# Patient Record
Sex: Male | Born: 1960 | Race: Black or African American | Hispanic: No | Marital: Single | Smoking: Never smoker
Health system: Southern US, Community
[De-identification: ages and names within clinical notes are randomized; demographics above are authoritative.]

## PROBLEM LIST (undated history)

## (undated) DIAGNOSIS — E7800 Pure hypercholesterolemia, unspecified: Secondary | ICD-10-CM

## (undated) DIAGNOSIS — I10 Essential (primary) hypertension: Secondary | ICD-10-CM

## (undated) HISTORY — DX: Essential (primary) hypertension: I10

## (undated) HISTORY — PX: KNEE SURGERY: SHX244

## (undated) HISTORY — DX: Pure hypercholesterolemia, unspecified: E78.00

---

## 2002-08-30 ENCOUNTER — Emergency Department (HOSPITAL_COMMUNITY): Admission: EM | Admit: 2002-08-30 | Discharge: 2002-08-30 | Payer: Self-pay | Admitting: *Deleted

## 2004-09-25 ENCOUNTER — Emergency Department (HOSPITAL_COMMUNITY): Admission: EM | Admit: 2004-09-25 | Discharge: 2004-09-25 | Payer: Self-pay | Admitting: Emergency Medicine

## 2006-02-20 ENCOUNTER — Ambulatory Visit (HOSPITAL_COMMUNITY): Admission: RE | Admit: 2006-02-20 | Discharge: 2006-02-20 | Payer: Self-pay | Admitting: Family Medicine

## 2007-06-17 ENCOUNTER — Ambulatory Visit: Payer: Self-pay | Admitting: Gastroenterology

## 2007-07-08 ENCOUNTER — Ambulatory Visit (HOSPITAL_COMMUNITY): Admission: RE | Admit: 2007-07-08 | Discharge: 2007-07-08 | Payer: Self-pay | Admitting: Gastroenterology

## 2007-07-08 ENCOUNTER — Ambulatory Visit: Payer: Self-pay | Admitting: Gastroenterology

## 2007-08-28 ENCOUNTER — Ambulatory Visit: Payer: Self-pay | Admitting: Gastroenterology

## 2008-06-11 DIAGNOSIS — K625 Hemorrhage of anus and rectum: Secondary | ICD-10-CM

## 2008-06-11 DIAGNOSIS — A5400 Gonococcal infection of lower genitourinary tract, unspecified: Secondary | ICD-10-CM | POA: Insufficient documentation

## 2008-06-11 DIAGNOSIS — A5601 Chlamydial cystitis and urethritis: Secondary | ICD-10-CM

## 2008-06-11 DIAGNOSIS — K649 Unspecified hemorrhoids: Secondary | ICD-10-CM | POA: Insufficient documentation

## 2008-06-11 DIAGNOSIS — K6289 Other specified diseases of anus and rectum: Secondary | ICD-10-CM

## 2008-06-11 DIAGNOSIS — I10 Essential (primary) hypertension: Secondary | ICD-10-CM | POA: Insufficient documentation

## 2011-02-21 NOTE — Assessment & Plan Note (Signed)
NAMEMarland Kitchen  MASARU, CHAMBERLIN                  CHART#:  [id number]   DATE:  08/28/2007                       DOB:  1961/08/21   REFERRING PHYSICIAN:  Tillman Abide, [facility] Department   PROBLEM LIST:  1. Rectal bleeding.  2. Hypertension.  3. History of GC and Chlamydia.   SUBJECTIVE:  Mr. Partin is a 50 year old male, who presents as a return  patient visit.  He had colonoscopy performed in October of 2008, which  revealed no etiology for his rectal bleeding and had presumed  hemorrhoids.  He was given AnusolHC rectal suppositories and other  recommendations to manage hard bowel movements.  He did not finish his  suppositories, but he is 90% better.  He has not seen any rectal  bleeding.  His bowel movements are fine.  Last Friday, he fell and hit  his head and now he has residual resolving bilateral black eyes.   OBJECTIVE:  Weight 200 pounds.  Height 5 feet 9 inches.  Temperature  97.3, blood pressure 132/90, pulse 88.  IN GENERAL:  He is in no apparent distress, alert and oriented times  four.  LUNGS:  Clear to auscultation bilaterally.  CARDIOVASCULAR EXAM:  Showed regular rhythm without murmurs.  ABDOMEN:  Bowel sounds are present, soft, nontender, nondistended.   ASSESSMENT:  Mr. Jackson is a 50 year old male whose rectal bleeding has  resolved.  He has had age-appropriate colon cancer screening.   Thank you for allowing me to see Mr. Deloatch in consultation.  My  recommendations follow.   RECOMMENDATIONS:  1. Screening colonoscopy in ten years.  2. Follow up with Dr. Cira Servant as needed.       Kassie Mends, M.D.  Electronically Signed     SM/MEDQ  D:  08/28/2007  T:  08/28/2007  Job:  [id number]   cc:   [facility] Tillman Abide

## 2011-02-21 NOTE — Op Note (Signed)
NAMEDECLAN, James White                 ACCOUNT NO.:  [id number]   MEDICAL RECORD NO.:  [id number]          PATIENT TYPE:  AMB   LOCATION:  DAY                           FACILITY:  [facility]   PHYSICIAN:  Kassie Mends, M.D.      DATE OF BIRTH:  [birth year]   DATE OF PROCEDURE:  07/08/2007  DATE OF DISCHARGE:                               OPERATIVE REPORT   REFERRING PHYSICIAN:  Kirk Ruths, M.D.   PROCEDURE:  Colonoscopy.   INDICATION FOR EXAM:  The patient is a 50 year old male who presented  with intermittent rectal bleeding.  No family history of colon cancer or  colon polyps.   FINDINGS:  1. Pan colonic diverticulosis.  Otherwise no polyps, masses,      inflammatory changes or AVMs seen.  2. Normal retroflexed view of the rectum.   RECOMMENDATIONS:  1. He should follow a high fiber diet.  He is given a handout on high-      fiber diet, diverticulosis and hemorrhoids.  2. No source for his rectal intermittent rectal bleeding could be      identified.  The most likely etiology for his rectal bleeding is      hemorrhoids.  3. Return patient visit in 3 months with Chyrl Civatte or Tana Coast      to reevaluate his rectal bleeding.  4. Screening colonoscopy in 10 years.   MEDICATIONS:  1. Demerol 25 mg IV.  2. Versed 3 mg IV.   PROCEDURE TECHNIQUE:  Physical exam was performed.  Informed consent was  obtained from the patient after explaining the benefits, risks and  alternatives to the procedure.  The patient was connected to the monitor  and placed in left lateral position.  Continuous oxygen was provided by  nasal cannula and IV medicine administered through an indwelling  cannula.  After administration of sedation and rectal exam, the  patient's rectum was intubated and the  scope was advanced under direct visualization to the cecum.  The scope  was removed slowly by carefully examining the color, texture, anatomy  and integrity of the mucosa on the way out.  The patient  was recovered  in endoscopy and discharged home in satisfactory condition.      Kassie Mends, M.D.  Electronically Signed     SM/MEDQ  D:  07/08/2007  T:  07/08/2007  Job:  [id number]   cc:   Kirk Ruths, M.D.  Fax: [phone]

## 2011-02-21 NOTE — Consult Note (Signed)
James White, White                 ACCOUNT NO.:  [id number]   MEDICAL RECORD NO.:  [id number]          PATIENT TYPE:  AMB   LOCATION:                                FACILITY:  [facility]   PHYSICIAN:  Kassie Mends, M.D.      DATE OF BIRTH:  12-Jun-1961   DATE OF CONSULTATION:  06/16/2007  DATE OF DISCHARGE:                                 CONSULTATION   PRIMARY PHYSICIAN:  Kirk Ruths, M.D.   REASON FOR VISIT:  Digestive problems.   HISTORY OF PRESENT ILLNESS:  James White is a 50 year old, [place]-American  male who has been complaining of digestive problems for 2 years.  His  symptoms actually are getting better.  His symptoms started when he was  treated for gonorrhea and chlamydia.  He states that the medicines  changed his digestive tract.  He states that foods that he was able to  tolerate now took the less time to go through his system.  Severe rectal  pain prompted his visit on today.  He states now his symptoms are not as  bad and only on occasion.  He felt like when he passed a stool his waste  had to get past something.  He called the [employer] nurse who told him it  might be an ulcer.  Her girlfriend thought it was hemorrhoids.  He saw  James White at the health department and she felt it was hemorrhoids.  He also saw blood when he wiped from time to time.  He only has one to  two bowel movements a week.  If he drinks soft drinks or eats fan tail  shrimp, he has watery stool.  Ice cream does the same thing as well.  He  usually has hard formed stool.  He denies any nausea, vomiting or  abdominal pain.   PAST MEDICAL HISTORY:  1. Reported history of hypertension.  2. History of gonorrhea and chlamydia.   PAST SURGICAL HISTORY:  None.   ALLERGIES:  No known drug allergies.   MEDICATIONS:  None.   FAMILY HISTORY:  He denies any family history of colon cancer or colon  polyps.   SOCIAL HISTORY:  He is not married.  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  He does not smoke.  He occasionally drinks  beer.  He may drink beer three times a week.  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   REVIEW OF SYSTEMS:  As per the HPI otherwise all systems negative.   PHYSICAL EXAM:  Weight 207 pounds, height 5 foot 9 inches, BMI 30.6  (obese) temperature 97.9, blood pressure 138/96, pulse 64.  GENERAL:  He is in no apparent distress, alert and oriented x4.  HEENT:  Atraumatic, normocephalic.  Pupils equal and reactive to light.  Mouth no oral lesions.  Posterior pharynx without erythema or exudate.  Poor dentition.  His neck has full range of motion and no  lymphadenopathy.  LUNGS:  Clear to auscultation bilaterally.  CARDIOVASCULAR:  Regular rhythm,  no murmur, normal S1-S2.  ABDOMEN:  Bowel sounds present, soft, nontender, nondistended, no  rebound or guarding.  He has no hepatosplenomegaly, abdominal bruits or  pulsatile masses.  Obese.  EXTREMITIES:  Without cyanosis, clubbing or edema.  NEURO:  He has no focal neurologic deficits.   ASSESSMENT:  James White is a 50 year old male who complains of rectal pain  with bowel movements.  He has a significant history of passing hard  stools.  He has also seeing rectal bleeding.  The differential diagnosis  includes anal fissure, hemorrhoids and a low likelihood of colorectal  polyps or colon cancer.   PLAN:  1. James White is given instructions for the management of his rectal      bleeding in writing.  He is asked to avoid straining with bowel      movements.  He is asked to 6-8 cups of water or juice daily.  He is      asked to follow a high fiber diet.  He is given a handout on high      fiber diet.  2. He is asked to take docusate sodium 100 mg tablets one 3 times a      day and to use Anusol-HC one twice daily for 14 days.  He may also      use glycerin suppositories prior to having a bowel movement for the      next 30 days.  3. He will be scheduled for a colonoscopy  after July 01, 2007 to      evaluate his rectal bleeding and for age-appropriate colon cancer      screening.  The [employer] of gastroenterology recommends      that African-Americans begin colon cancer screening at the age of      70.  4. Follow-up visit to see me in two months.      Kassie Mends, M.D.  Electronically Signed     SM/MEDQ  D:  06/17/2007  T:  06/17/2007  Job:  [id number]   cc:   Kirk Ruths, M.D.  Fax: [phone]

## 2011-12-14 ENCOUNTER — Encounter (INDEPENDENT_AMBULATORY_CARE_PROVIDER_SITE_OTHER): Payer: Self-pay | Admitting: *Deleted

## 2012-01-03 ENCOUNTER — Encounter (INDEPENDENT_AMBULATORY_CARE_PROVIDER_SITE_OTHER): Payer: Self-pay | Admitting: Internal Medicine

## 2012-01-03 ENCOUNTER — Ambulatory Visit (INDEPENDENT_AMBULATORY_CARE_PROVIDER_SITE_OTHER): Payer: Managed Care, Other (non HMO) | Admitting: Internal Medicine

## 2012-01-03 DIAGNOSIS — K219 Gastro-esophageal reflux disease without esophagitis: Secondary | ICD-10-CM

## 2012-01-03 DIAGNOSIS — R1310 Dysphagia, unspecified: Secondary | ICD-10-CM | POA: Insufficient documentation

## 2012-01-03 MED ORDER — PANTOPRAZOLE SODIUM 20 MG PO TBEC: 40.0000 mg | DELAYED_RELEASE_TABLET | Freq: Every day | ORAL | Status: AC

## 2012-01-03 NOTE — Patient Instructions (Signed)
GERD diet. Protonix 40mg  once daily.

## 2012-01-03 NOTE — Progress Notes (Signed)
Subjective:     Patient ID: James White, male   DOB: 06-09-1961, 51 y.o.   MRN: [id number]  HPI Jozsef is a 51 yr old male referred to our office by Landry Mellow [facility] at [facility].  He tells me that if he eats something spicy he will have a sharp pain in his esophagus. He he get excited or joking, he will have a sharp pain in his esophagus.  If he drinks or eats something it feels like it is coming back up in his esophagus. He feels like sometimes food is not going down his esophagus. Symptoms x 2 yrs He has had to cough the bolus back up. No problem liquids.   Appetite is good. No weight loss.  He usually has a BM about 1-2 a day.  No melena. Sometimes he will blood when he is constipated. He has tried Designer, fashion/clothing but forgets to take it.    Review of Systems see hpi Current Outpatient Prescriptions  Medication Sig Dispense Refill  . Olmesartan-Amlodipine-HCTZ (TRIBENZOR PO) Take by mouth.       Past Medical History  Diagnosis Date  . Hypertension   . High cholesterol    History reviewed. No pertinent past surgical history. Family Status  Relation Status Death Age  . Mother Deceased     Thyroid  . Father Deceased     etoh  . Brother      One deceased from GSW in head. One has enlarged heart and  and MI. One has diabetes., CVA, and RA   History   Social History  . Marital Status: Single    Spouse Name: N/A    Number of Children: N/A  . Years of Education: N/A   Occupational History  . Not on file.   Social History Main Topics  . Smoking status: Never Smoker   . Smokeless tobacco: Not on file  . Alcohol Use: No  . Drug Use: No  . Sexually Active: Not on file   Other Topics Concern  . Not on file   Social History Narrative  . No narrative on file   No Known Allergies      Objective:   Physical Exam Filed Vitals:   01/03/12 1522  Height: 5' 8.5" (1.74 m)  Weight: 223 lb 1.6 oz (101.197 kg)  Alert and oriented. Skin warm and dry. Oral mucosa is moist.   .  Sclera anicteric, conjunctivae is pink. Thyroid not enlarged. No cervical lymphadenopathy. Lungs clear. Heart regular rate and rhythm.  Abdomen is soft. Bowel sounds are positive. No hepatomegaly. No abdominal masses felt. No tenderness.  No edema to lower extremities. Patient is alert and oriented.       Assessment:    Uncontrolled GERD, Dysphagia. He is not maintained on a PPI at this time.    Plan:    Rx for Protonix. Elevate head of bed. GERD diet. Barium Pill study.

## 2012-01-04 ENCOUNTER — Ambulatory Visit (HOSPITAL_COMMUNITY)
Admission: RE | Admit: 2012-01-04 | Discharge: 2012-01-04 | Disposition: A | Discharge status: Home / Self Care | Payer: Managed Care, Other (non HMO) | Source: Ambulatory Visit | Attending: Internal Medicine | Admitting: Internal Medicine

## 2012-01-04 DIAGNOSIS — K224 Dyskinesia of esophagus: Secondary | ICD-10-CM | POA: Insufficient documentation

## 2012-01-04 DIAGNOSIS — K219 Gastro-esophageal reflux disease without esophagitis: Secondary | ICD-10-CM | POA: Insufficient documentation

## 2012-01-04 DIAGNOSIS — R1310 Dysphagia, unspecified: Secondary | ICD-10-CM | POA: Insufficient documentation

## 2012-04-30 ENCOUNTER — Ambulatory Visit (HOSPITAL_COMMUNITY)
Admission: RE | Admit: 2012-04-30 | Discharge: 2012-04-30 | Disposition: A | Discharge status: Home / Self Care | Payer: Managed Care, Other (non HMO) | Source: Ambulatory Visit | Attending: Orthopedic Surgery | Admitting: Orthopedic Surgery

## 2012-04-30 ENCOUNTER — Other Ambulatory Visit (HOSPITAL_COMMUNITY): Payer: Self-pay | Admitting: Orthopedic Surgery

## 2012-04-30 DIAGNOSIS — Z139 Encounter for screening, unspecified: Secondary | ICD-10-CM

## 2012-04-30 DIAGNOSIS — Z1389 Encounter for screening for other disorder: Secondary | ICD-10-CM | POA: Insufficient documentation

## 2013-05-04 ENCOUNTER — Encounter (HOSPITAL_COMMUNITY): Payer: Self-pay | Admitting: *Deleted

## 2013-05-04 ENCOUNTER — Emergency Department (HOSPITAL_COMMUNITY)
Admission: EM | Admit: 2013-05-04 | Discharge: 2013-05-04 | Disposition: A | Discharge status: Home / Self Care | Payer: Managed Care, Other (non HMO) | Attending: Emergency Medicine | Admitting: Emergency Medicine

## 2013-05-04 DIAGNOSIS — E7800 Pure hypercholesterolemia, unspecified: Secondary | ICD-10-CM | POA: Insufficient documentation

## 2013-05-04 DIAGNOSIS — I10 Essential (primary) hypertension: Secondary | ICD-10-CM | POA: Insufficient documentation

## 2013-05-04 DIAGNOSIS — B029 Zoster without complications: Secondary | ICD-10-CM | POA: Insufficient documentation

## 2013-05-04 MED ORDER — HYDROCODONE-ACETAMINOPHEN 5-325 MG PO TABS: 2.0000 | ORAL_TABLET | ORAL | Status: DC | PRN

## 2013-05-04 MED ORDER — ACYCLOVIR 400 MG PO TABS: 800.0000 mg | ORAL_TABLET | Freq: Every day | ORAL | Status: DC

## 2013-05-04 MED ORDER — PREDNISONE 10 MG PO TABS: 20.0000 mg | ORAL_TABLET | Freq: Two times a day (BID) | ORAL | Status: DC

## 2013-05-04 NOTE — ED Provider Notes (Signed)
  CSN: [id number]     Arrival date & time 05/04/13  0010 History     First MD Initiated Contact with Patient 05/04/13 0041     Chief Complaint  Patient presents with  . Insect Bite   (Consider location/radiation/quality/duration/timing/severity/associated sxs/prior Treatment) Patient is a 52 y.o. male presenting with rash. The history is provided by the patient.  Rash Pain location: right back. Pain quality: burning   Pain radiates to:  Does not radiate Pain severity:  Moderate Onset quality:  Gradual Duration:  5 days Timing:  Constant Progression:  Worsening Chronicity:  New Relieved by:  Nothing Worsened by:  Nothing tried   Past Medical History  Diagnosis Date  . Hypertension   . High cholesterol    Past Surgical History  Procedure Laterality Date  . Knee surgery     No family history on file. History  Substance Use Topics  . Smoking status: Never Smoker   . Smokeless tobacco: Not on file  . Alcohol Use: No    Review of Systems  Skin: Positive for rash.  All other systems reviewed and are negative.    Allergies  Review of patient's allergies indicates no known allergies.  Home Medications   Current Outpatient Rx  Name  Route  Sig  Dispense  Refill  . Olmesartan-Amlodipine-HCTZ (TRIBENZOR PO)   Oral   Take by mouth.         Marland Kitchen acyclovir (ZOVIRAX) 400 MG tablet   Oral   Take 2 tablets (800 mg total) by mouth 5 (five) times daily.   50 tablet   0   . HYDROcodone-acetaminophen (NORCO) 5-325 MG per tablet   Oral   Take 2 tablets by mouth every 4 (four) hours as needed for pain.   20 tablet   0   . predniSONE (DELTASONE) 10 MG tablet   Oral   Take 2 tablets (20 mg total) by mouth 2 (two) times daily.   20 tablet   0    BP 151/110  Pulse 77  Temp(Src) 98 F (36.7 C) (Oral)  Resp 20  Ht 5\' 8"  (1.727 m)  Wt 218 lb (98.884 kg)  BMI 33.15 kg/m2  SpO2 98% Physical Exam  Nursing note and vitals reviewed. Constitutional: He is oriented to  person, place, and time. He appears well-developed and well-nourished. No distress.  HENT:  Head: Normocephalic and atraumatic.  Mouth/Throat: Oropharynx is clear and moist.  Neck: Normal range of motion. Neck supple.  Cardiovascular: Normal rate, regular rhythm and normal heart sounds.   No murmur heard. Pulmonary/Chest: Effort normal and breath sounds normal. No respiratory distress.  Abdominal: Bowel sounds are normal.  Musculoskeletal: Normal range of motion.  Neurological: He is alert and oriented to person, place, and time.  Skin: He is not diaphoretic.  There is a vesicular rash in a dermatomal pattern noted on the right mid posterior thoracic region.      ED Course   Procedures (including critical care time)  Labs Reviewed - No data to display No results found. 1. Shingles     MDM  Shingles.  Will treat accordingly.  Return prn.  Geoffery Lyons, MD 05/04/13 2026

## 2013-05-04 NOTE — ED Notes (Signed)
Pt reports he thinks he has an insect bit to back. Slight burning sensation

## 2017-08-22 ENCOUNTER — Ambulatory Visit (HOSPITAL_COMMUNITY)
Admission: RE | Admit: 2017-08-22 | Discharge: 2017-08-22 | Disposition: A | Discharge status: Home / Self Care | Payer: 59 | Source: Ambulatory Visit | Attending: Family Medicine | Admitting: Family Medicine

## 2017-08-22 ENCOUNTER — Other Ambulatory Visit (HOSPITAL_COMMUNITY): Payer: Self-pay | Admitting: Family Medicine

## 2017-08-22 DIAGNOSIS — S82002A Unspecified fracture of left patella, initial encounter for closed fracture: Secondary | ICD-10-CM

## 2017-08-22 DIAGNOSIS — M7989 Other specified soft tissue disorders: Secondary | ICD-10-CM | POA: Diagnosis not present

## 2017-08-22 DIAGNOSIS — M1712 Unilateral primary osteoarthritis, left knee: Secondary | ICD-10-CM | POA: Insufficient documentation

## 2017-08-22 DIAGNOSIS — M25569 Pain in unspecified knee: Secondary | ICD-10-CM | POA: Diagnosis not present

## 2019-05-16 IMAGING — DX DG KNEE COMPLETE 4+V*L*
5 series · 5 of 5 positions shown · non-contrast
Comparison: None.

CLINICAL DATA: Status post blunt trauma.  Pain and swelling.

EXAM:
LEFT KNEE - COMPLETE 4+ VIEW

[knee ap]
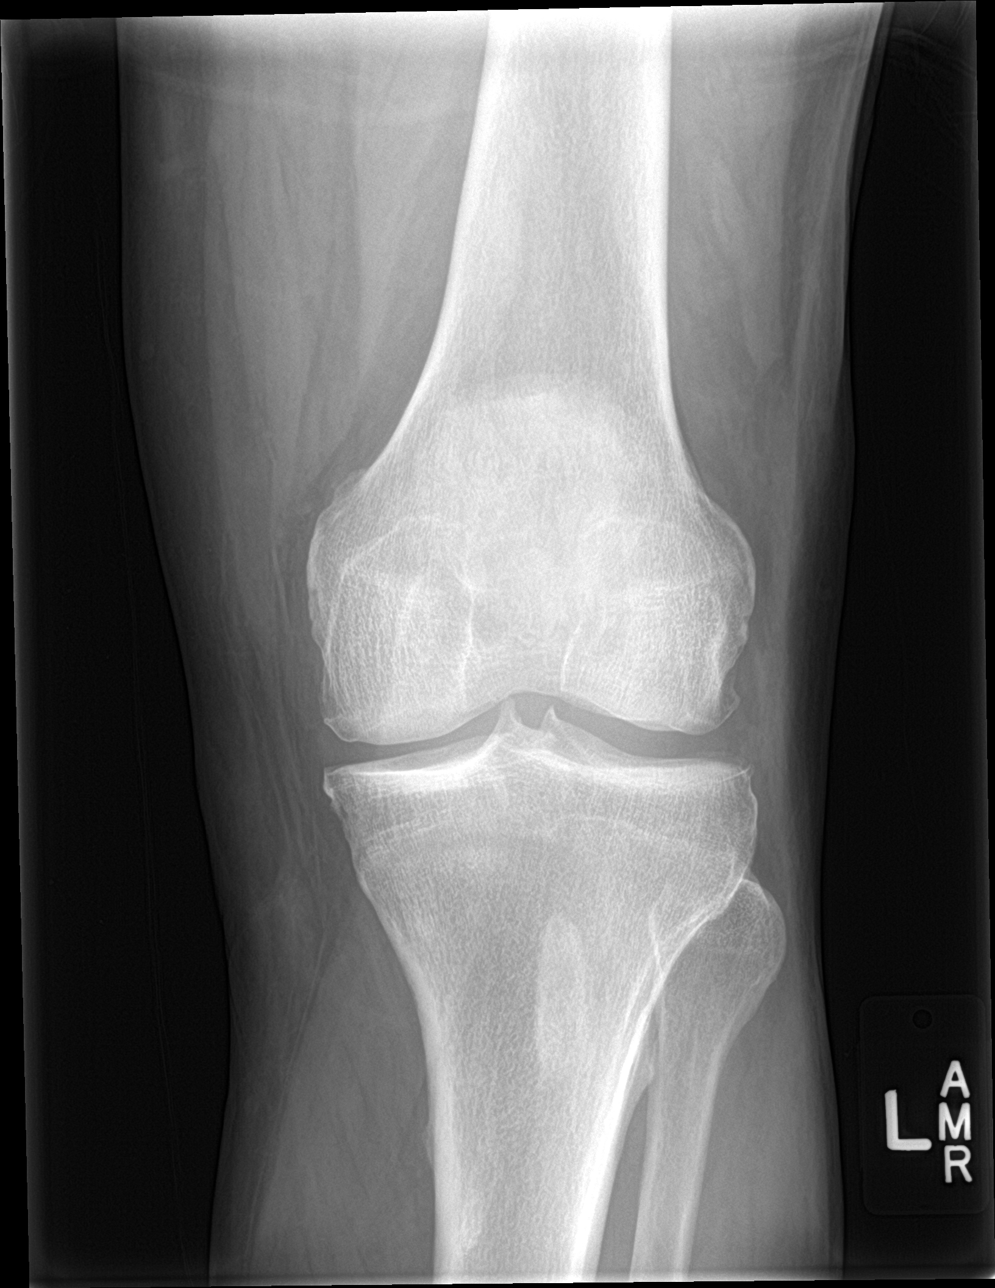

[tunnel]
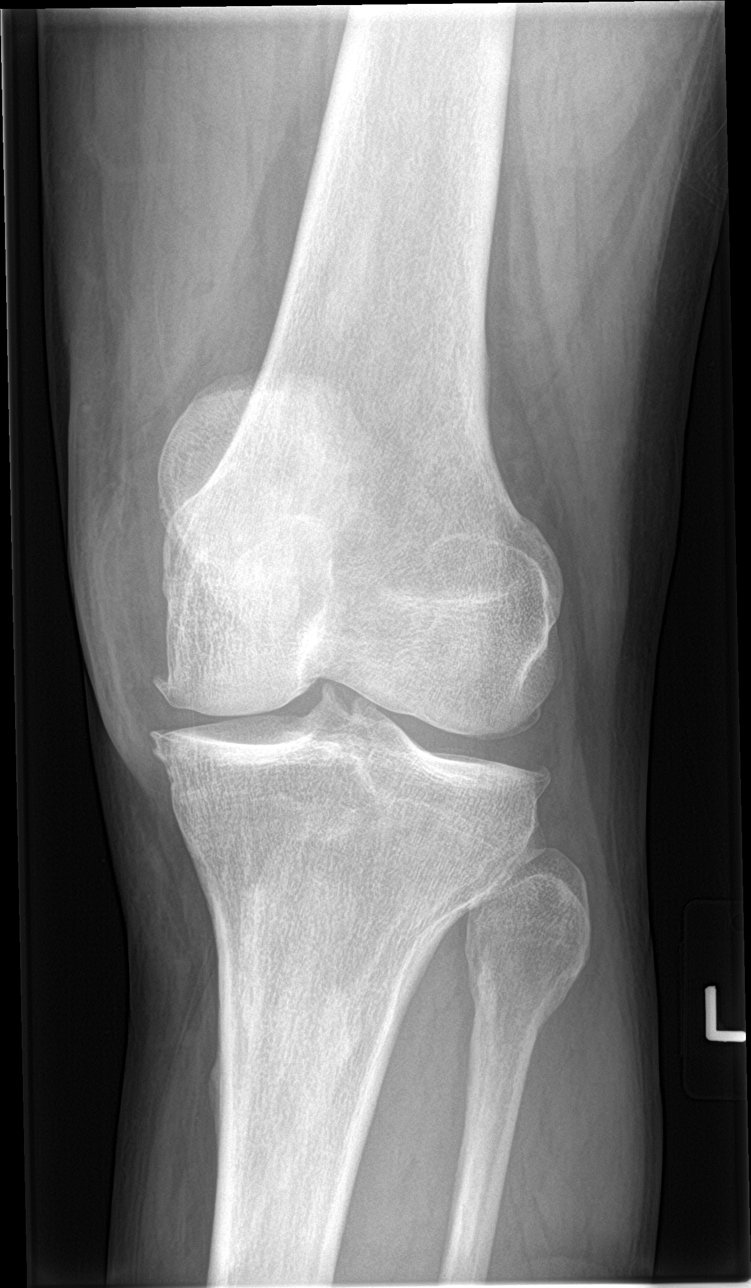

[knee lat]
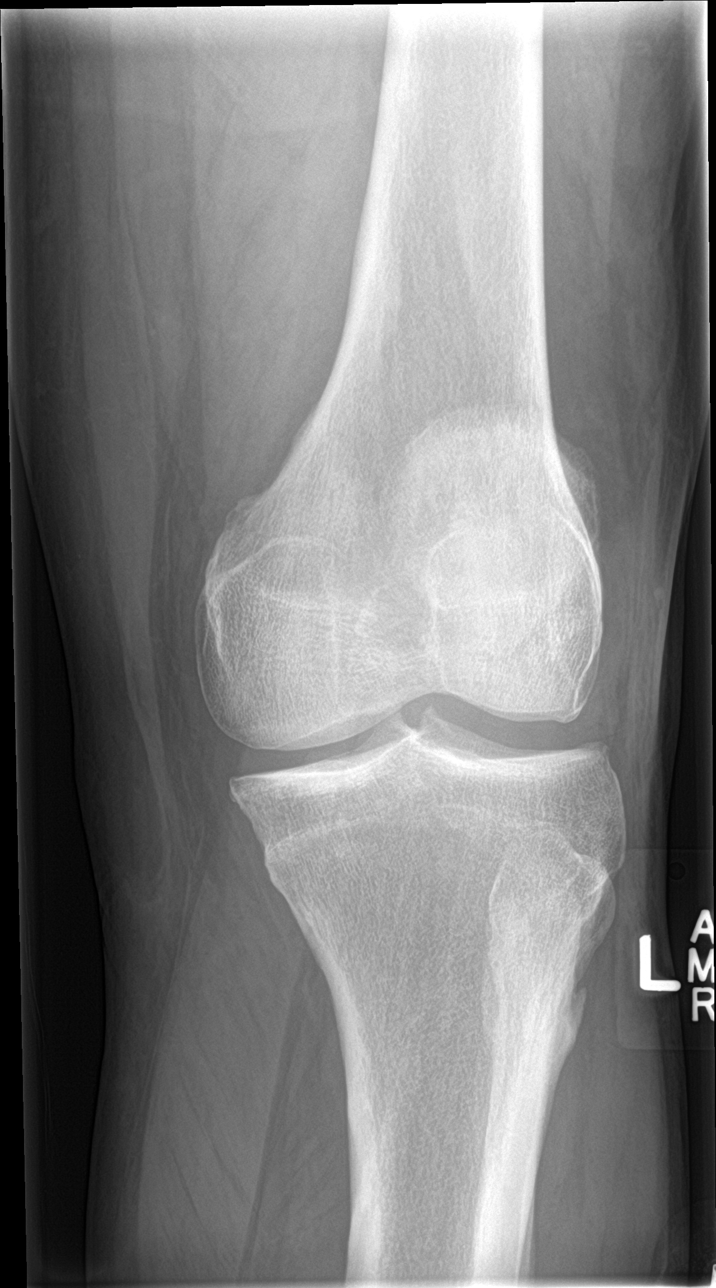

[knee sunrise (1 of 2)]
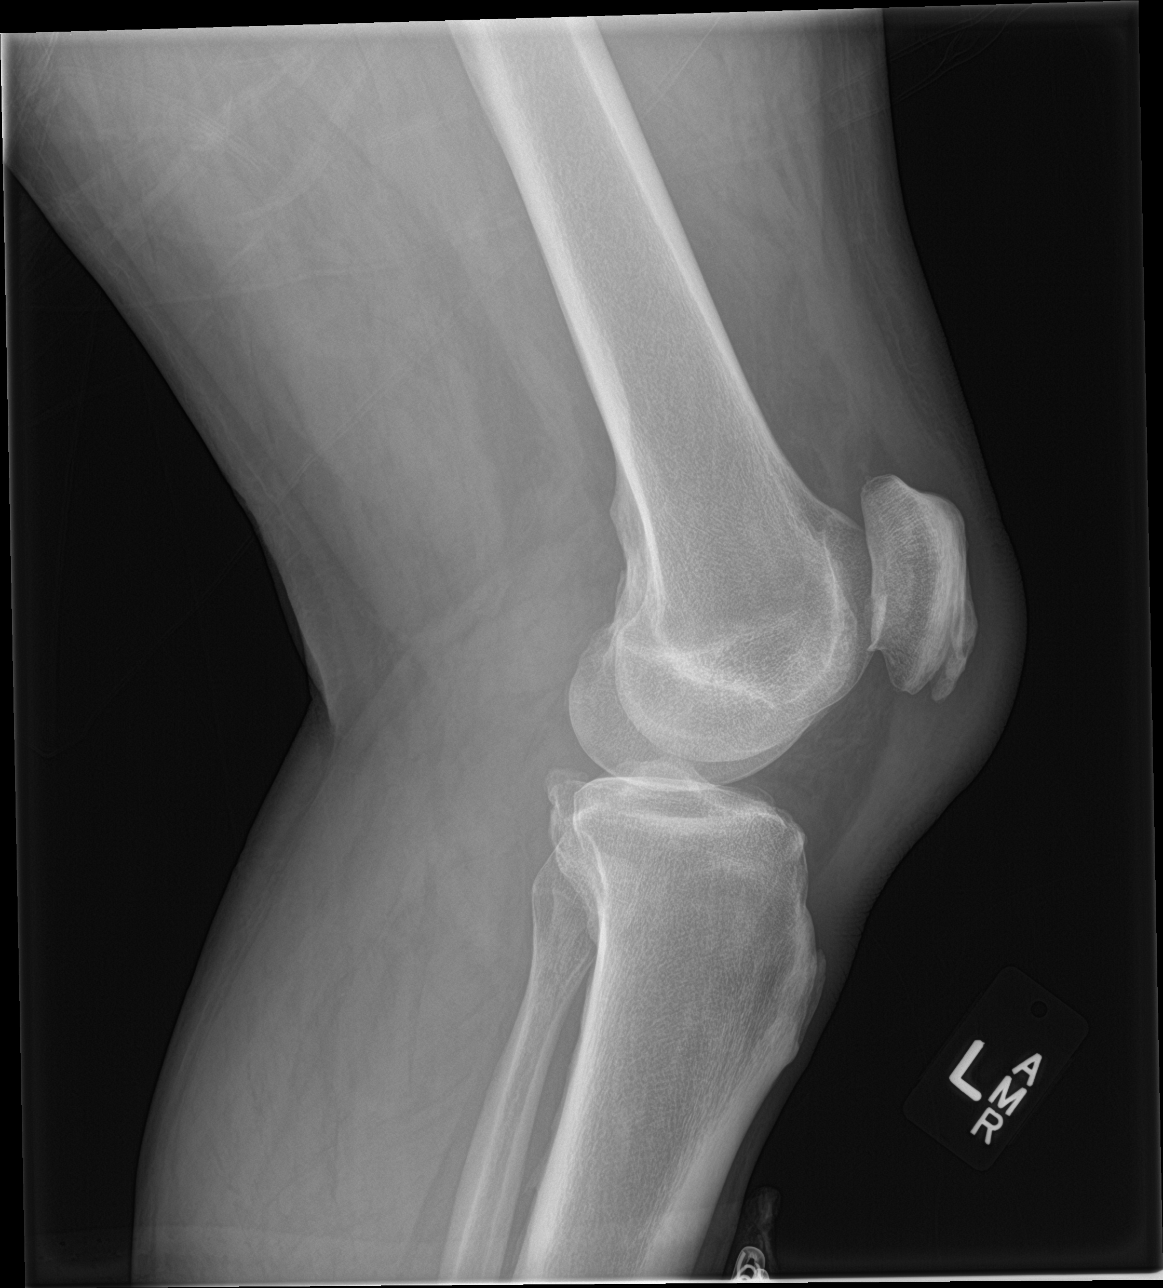

[knee sunrise (2 of 2)]
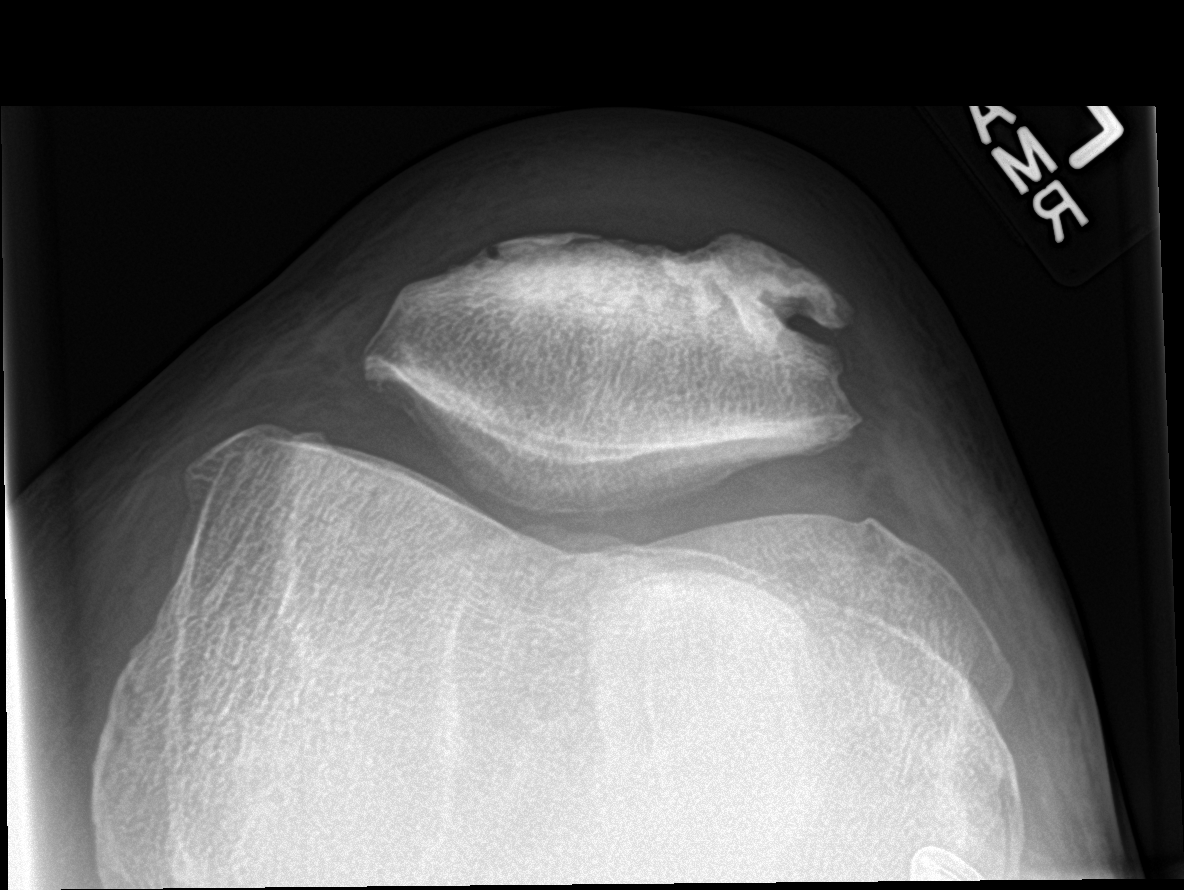

[5 of 5 positions shown; findings below may reference images not displayed]

FINDINGS: No evidence of fracture, dislocation, or joint effusion. Three
compartment mild osteoarthritic changes of the left knee.
Prepatellar soft tissue swelling.
IMPRESSION: Prepatellar soft tissue swelling without evidence of fracture or
dislocation.

3 compartment mild osteoarthritic changes of the left knee.

## 2021-04-26 DIAGNOSIS — Z6834 Body mass index (BMI) 34.0-34.9, adult: Secondary | ICD-10-CM | POA: Diagnosis not present

## 2021-04-26 DIAGNOSIS — Z1331 Encounter for screening for depression: Secondary | ICD-10-CM | POA: Diagnosis not present

## 2021-04-26 DIAGNOSIS — E782 Mixed hyperlipidemia: Secondary | ICD-10-CM | POA: Diagnosis not present

## 2021-04-26 DIAGNOSIS — I10 Essential (primary) hypertension: Secondary | ICD-10-CM | POA: Diagnosis not present

## 2021-04-26 DIAGNOSIS — E118 Type 2 diabetes mellitus with unspecified complications: Secondary | ICD-10-CM | POA: Diagnosis not present

## 2021-04-26 DIAGNOSIS — Z0001 Encounter for general adult medical examination with abnormal findings: Secondary | ICD-10-CM | POA: Diagnosis not present

## 2021-04-26 DIAGNOSIS — Z9119 Patient's noncompliance with other medical treatment and regimen: Secondary | ICD-10-CM | POA: Diagnosis not present

## 2021-04-26 DIAGNOSIS — Z1389 Encounter for screening for other disorder: Secondary | ICD-10-CM | POA: Diagnosis not present

## 2021-04-26 DIAGNOSIS — E6609 Other obesity due to excess calories: Secondary | ICD-10-CM | POA: Diagnosis not present

## 2021-04-26 DIAGNOSIS — E7849 Other hyperlipidemia: Secondary | ICD-10-CM | POA: Diagnosis not present

## 2022-04-17 ENCOUNTER — Emergency Department (HOSPITAL_COMMUNITY): Payer: Medicaid Other

## 2022-04-17 ENCOUNTER — Encounter (HOSPITAL_COMMUNITY): Payer: Self-pay

## 2022-04-17 ENCOUNTER — Inpatient Hospital Stay (HOSPITAL_COMMUNITY): Payer: Medicaid Other

## 2022-04-17 ENCOUNTER — Inpatient Hospital Stay (HOSPITAL_COMMUNITY)
Admission: EM | Admit: 2022-04-17 | Discharge: 2022-05-09 | DRG: 003 | Disposition: E | Payer: Medicaid Other | Attending: Pulmonary Disease | Admitting: Pulmonary Disease

## 2022-04-17 ENCOUNTER — Other Ambulatory Visit: Payer: Self-pay

## 2022-04-17 ENCOUNTER — Other Ambulatory Visit (HOSPITAL_COMMUNITY): Payer: Self-pay

## 2022-04-17 ENCOUNTER — Inpatient Hospital Stay (HOSPITAL_COMMUNITY): Admission: EM | Disposition: E | Payer: Self-pay | Source: Home / Self Care | Attending: Pulmonary Disease

## 2022-04-17 ENCOUNTER — Encounter (HOSPITAL_COMMUNITY): Admission: EM | Disposition: E | Payer: Self-pay | Source: Home / Self Care | Attending: Pulmonary Disease

## 2022-04-17 DIAGNOSIS — T503X5A Adverse effect of electrolytic, caloric and water-balance agents, initial encounter: Secondary | ICD-10-CM | POA: Diagnosis not present

## 2022-04-17 DIAGNOSIS — I97618 Postprocedural hemorrhage and hematoma of a circulatory system organ or structure following other circulatory system procedure: Secondary | ICD-10-CM

## 2022-04-17 DIAGNOSIS — I2510 Atherosclerotic heart disease of native coronary artery without angina pectoris: Secondary | ICD-10-CM | POA: Diagnosis not present

## 2022-04-17 DIAGNOSIS — D62 Acute posthemorrhagic anemia: Secondary | ICD-10-CM | POA: Diagnosis not present

## 2022-04-17 DIAGNOSIS — I469 Cardiac arrest, cause unspecified: Secondary | ICD-10-CM

## 2022-04-17 DIAGNOSIS — D6959 Other secondary thrombocytopenia: Secondary | ICD-10-CM | POA: Diagnosis not present

## 2022-04-17 DIAGNOSIS — I4891 Unspecified atrial fibrillation: Secondary | ICD-10-CM | POA: Diagnosis present

## 2022-04-17 DIAGNOSIS — Z66 Do not resuscitate: Secondary | ICD-10-CM | POA: Diagnosis not present

## 2022-04-17 DIAGNOSIS — I442 Atrioventricular block, complete: Secondary | ICD-10-CM | POA: Diagnosis present

## 2022-04-17 DIAGNOSIS — Y718 Miscellaneous cardiovascular devices associated with adverse incidents, not elsewhere classified: Secondary | ICD-10-CM | POA: Diagnosis not present

## 2022-04-17 DIAGNOSIS — N170 Acute kidney failure with tubular necrosis: Secondary | ICD-10-CM | POA: Diagnosis present

## 2022-04-17 DIAGNOSIS — I462 Cardiac arrest due to underlying cardiac condition: Secondary | ICD-10-CM | POA: Diagnosis not present

## 2022-04-17 DIAGNOSIS — J9602 Acute respiratory failure with hypercapnia: Secondary | ICD-10-CM | POA: Diagnosis present

## 2022-04-17 DIAGNOSIS — E1165 Type 2 diabetes mellitus with hyperglycemia: Secondary | ICD-10-CM | POA: Insufficient documentation

## 2022-04-17 DIAGNOSIS — I130 Hypertensive heart and chronic kidney disease with heart failure and stage 1 through stage 4 chronic kidney disease, or unspecified chronic kidney disease: Secondary | ICD-10-CM | POA: Diagnosis present

## 2022-04-17 DIAGNOSIS — Z6841 Body Mass Index (BMI) 40.0 and over, adult: Secondary | ICD-10-CM | POA: Diagnosis not present

## 2022-04-17 DIAGNOSIS — T85838A Hemorrhage due to other internal prosthetic devices, implants and grafts, initial encounter: Secondary | ICD-10-CM | POA: Diagnosis not present

## 2022-04-17 DIAGNOSIS — N183 Chronic kidney disease, stage 3 unspecified: Secondary | ICD-10-CM | POA: Diagnosis present

## 2022-04-17 DIAGNOSIS — I509 Heart failure, unspecified: Secondary | ICD-10-CM | POA: Diagnosis not present

## 2022-04-17 DIAGNOSIS — I213 ST elevation (STEMI) myocardial infarction of unspecified site: Secondary | ICD-10-CM

## 2022-04-17 DIAGNOSIS — I2109 ST elevation (STEMI) myocardial infarction involving other coronary artery of anterior wall: Secondary | ICD-10-CM | POA: Diagnosis present

## 2022-04-17 DIAGNOSIS — D689 Coagulation defect, unspecified: Secondary | ICD-10-CM | POA: Diagnosis present

## 2022-04-17 DIAGNOSIS — R7402 Elevation of levels of lactic acid dehydrogenase (LDH): Secondary | ICD-10-CM | POA: Diagnosis present

## 2022-04-17 DIAGNOSIS — Z79899 Other long term (current) drug therapy: Secondary | ICD-10-CM

## 2022-04-17 DIAGNOSIS — E876 Hypokalemia: Secondary | ICD-10-CM | POA: Diagnosis not present

## 2022-04-17 DIAGNOSIS — E875 Hyperkalemia: Secondary | ICD-10-CM | POA: Diagnosis not present

## 2022-04-17 DIAGNOSIS — J9601 Acute respiratory failure with hypoxia: Secondary | ICD-10-CM | POA: Diagnosis present

## 2022-04-17 DIAGNOSIS — I255 Ischemic cardiomyopathy: Secondary | ICD-10-CM | POA: Diagnosis present

## 2022-04-17 DIAGNOSIS — I110 Hypertensive heart disease with heart failure: Secondary | ICD-10-CM | POA: Diagnosis not present

## 2022-04-17 DIAGNOSIS — R001 Bradycardia, unspecified: Secondary | ICD-10-CM | POA: Diagnosis present

## 2022-04-17 DIAGNOSIS — E1122 Type 2 diabetes mellitus with diabetic chronic kidney disease: Secondary | ICD-10-CM | POA: Diagnosis present

## 2022-04-17 DIAGNOSIS — Z515 Encounter for palliative care: Secondary | ICD-10-CM | POA: Diagnosis not present

## 2022-04-17 DIAGNOSIS — Z781 Physical restraint status: Secondary | ICD-10-CM

## 2022-04-17 DIAGNOSIS — I472 Ventricular tachycardia, unspecified: Secondary | ICD-10-CM | POA: Diagnosis not present

## 2022-04-17 DIAGNOSIS — I252 Old myocardial infarction: Secondary | ICD-10-CM

## 2022-04-17 DIAGNOSIS — J9621 Acute and chronic respiratory failure with hypoxia: Secondary | ICD-10-CM

## 2022-04-17 DIAGNOSIS — R578 Other shock: Secondary | ICD-10-CM | POA: Diagnosis not present

## 2022-04-17 DIAGNOSIS — E8729 Other acidosis: Secondary | ICD-10-CM | POA: Diagnosis present

## 2022-04-17 DIAGNOSIS — T82838A Hemorrhage of vascular prosthetic devices, implants and grafts, initial encounter: Secondary | ICD-10-CM

## 2022-04-17 DIAGNOSIS — E7800 Pure hypercholesterolemia, unspecified: Secondary | ICD-10-CM | POA: Diagnosis present

## 2022-04-17 DIAGNOSIS — I5021 Acute systolic (congestive) heart failure: Secondary | ICD-10-CM | POA: Diagnosis present

## 2022-04-17 DIAGNOSIS — R570 Cardiogenic shock: Secondary | ICD-10-CM | POA: Diagnosis present

## 2022-04-17 HISTORY — PX: CORONARY STENT INTERVENTION W/IMPELLA: CATH118235

## 2022-04-17 HISTORY — PX: TEMPORARY PACEMAKER: CATH118268

## 2022-04-17 HISTORY — PX: ECMO CANNULATION: CATH118321

## 2022-04-17 HISTORY — PX: RIGHT/LEFT HEART CATH AND CORONARY ANGIOGRAPHY: CATH118266

## 2022-04-17 LAB — POCT I-STAT 7, (LYTES, BLD GAS, ICA,H+H)
Acid-base deficit: 11 mmol/L — ABNORMAL HIGH (ref 0.0–2.0)
Acid-base deficit: 15 mmol/L — ABNORMAL HIGH (ref 0.0–2.0)
Acid-base deficit: 15 mmol/L — ABNORMAL HIGH (ref 0.0–2.0)
Acid-base deficit: 17 mmol/L — ABNORMAL HIGH (ref 0.0–2.0)
Acid-base deficit: 13 mmol/L — ABNORMAL HIGH (ref 0.0–2.0)
Acid-base deficit: 9 mmol/L — ABNORMAL HIGH (ref 0.0–2.0)
Acid-base deficit: 11 mmol/L — ABNORMAL HIGH (ref 0.0–2.0)
Acid-base deficit: 10 mmol/L — ABNORMAL HIGH (ref 0.0–2.0)
Acid-base deficit: 11 mmol/L — ABNORMAL HIGH (ref 0.0–2.0)
Acid-base deficit: 13 mmol/L — ABNORMAL HIGH (ref 0.0–2.0)
Acid-base deficit: 9 mmol/L — ABNORMAL HIGH (ref 0.0–2.0)
Acid-base deficit: 6 mmol/L — ABNORMAL HIGH (ref 0.0–2.0)
Acid-base deficit: 6 mmol/L — ABNORMAL HIGH (ref 0.0–2.0)
Acid-base deficit: 2 mmol/L (ref 0.0–2.0)
Bicarbonate: 15.3 mmol/L — ABNORMAL LOW (ref 20.0–28.0)
Bicarbonate: 18.2 mmol/L — ABNORMAL LOW (ref 20.0–28.0)
Bicarbonate: 14.5 mmol/L — ABNORMAL LOW (ref 20.0–28.0)
Bicarbonate: 15.2 mmol/L — ABNORMAL LOW (ref 20.0–28.0)
Bicarbonate: 17.1 mmol/L — ABNORMAL LOW (ref 20.0–28.0)
Bicarbonate: 18.9 mmol/L — ABNORMAL LOW (ref 20.0–28.0)
Bicarbonate: 17.4 mmol/L — ABNORMAL LOW (ref 20.0–28.0)
Bicarbonate: 18 mmol/L — ABNORMAL LOW (ref 20.0–28.0)
Bicarbonate: 15.3 mmol/L — ABNORMAL LOW (ref 20.0–28.0)
Bicarbonate: 12.9 mmol/L — ABNORMAL LOW (ref 20.0–28.0)
Bicarbonate: 17 mmol/L — ABNORMAL LOW (ref 20.0–28.0)
Bicarbonate: 22.4 mmol/L (ref 20.0–28.0)
Bicarbonate: 22 mmol/L (ref 20.0–28.0)
Bicarbonate: 25.3 mmol/L (ref 20.0–28.0)
Calcium, Ion: 1 mmol/L — ABNORMAL LOW (ref 1.15–1.40)
Calcium, Ion: 1.18 mmol/L (ref 1.15–1.40)
Calcium, Ion: 0.95 mmol/L — ABNORMAL LOW (ref 1.15–1.40)
Calcium, Ion: 0.95 mmol/L — ABNORMAL LOW (ref 1.15–1.40)
Calcium, Ion: 0.91 mmol/L — ABNORMAL LOW (ref 1.15–1.40)
Calcium, Ion: 0.83 mmol/L — CL (ref 1.15–1.40)
Calcium, Ion: 0.9 mmol/L — ABNORMAL LOW (ref 1.15–1.40)
Calcium, Ion: 0.78 mmol/L — CL (ref 1.15–1.40)
Calcium, Ion: 0.82 mmol/L — CL (ref 1.15–1.40)
Calcium, Ion: 0.82 mmol/L — CL (ref 1.15–1.40)
Calcium, Ion: 0.84 mmol/L — CL (ref 1.15–1.40)
Calcium, Ion: 0.95 mmol/L — ABNORMAL LOW (ref 1.15–1.40)
Calcium, Ion: 0.92 mmol/L — ABNORMAL LOW (ref 1.15–1.40)
Calcium, Ion: 0.95 mmol/L — ABNORMAL LOW (ref 1.15–1.40)
HCT: 40 % (ref 39.0–52.0)
HCT: 45 % (ref 39.0–52.0)
HCT: 37 % — ABNORMAL LOW (ref 39.0–52.0)
HCT: 38 % — ABNORMAL LOW (ref 39.0–52.0)
HCT: 33 % — ABNORMAL LOW (ref 39.0–52.0)
HCT: 24 % — ABNORMAL LOW (ref 39.0–52.0)
HCT: 33 % — ABNORMAL LOW (ref 39.0–52.0)
HCT: 33 % — ABNORMAL LOW (ref 39.0–52.0)
HCT: 27 % — ABNORMAL LOW (ref 39.0–52.0)
HCT: 29 % — ABNORMAL LOW (ref 39.0–52.0)
HCT: 28 % — ABNORMAL LOW (ref 39.0–52.0)
HCT: 31 % — ABNORMAL LOW (ref 39.0–52.0)
HCT: 33 % — ABNORMAL LOW (ref 39.0–52.0)
HCT: 34 % — ABNORMAL LOW (ref 39.0–52.0)
Hemoglobin: 13.6 g/dL (ref 13.0–17.0)
Hemoglobin: 15.3 g/dL (ref 13.0–17.0)
Hemoglobin: 12.6 g/dL — ABNORMAL LOW (ref 13.0–17.0)
Hemoglobin: 12.9 g/dL — ABNORMAL LOW (ref 13.0–17.0)
Hemoglobin: 11.2 g/dL — ABNORMAL LOW (ref 13.0–17.0)
Hemoglobin: 8.2 g/dL — ABNORMAL LOW (ref 13.0–17.0)
Hemoglobin: 11.2 g/dL — ABNORMAL LOW (ref 13.0–17.0)
Hemoglobin: 11.2 g/dL — ABNORMAL LOW (ref 13.0–17.0)
Hemoglobin: 9.2 g/dL — ABNORMAL LOW (ref 13.0–17.0)
Hemoglobin: 9.9 g/dL — ABNORMAL LOW (ref 13.0–17.0)
Hemoglobin: 9.5 g/dL — ABNORMAL LOW (ref 13.0–17.0)
Hemoglobin: 10.5 g/dL — ABNORMAL LOW (ref 13.0–17.0)
Hemoglobin: 11.2 g/dL — ABNORMAL LOW (ref 13.0–17.0)
Hemoglobin: 11.6 g/dL — ABNORMAL LOW (ref 13.0–17.0)
O2 Saturation: 66 %
O2 Saturation: 69 %
O2 Saturation: 68 %
O2 Saturation: 71 %
O2 Saturation: 91 %
O2 Saturation: 100 %
O2 Saturation: 100 %
O2 Saturation: 95 %
O2 Saturation: 88 %
O2 Saturation: 97 %
O2 Saturation: 90 %
O2 Saturation: 100 %
O2 Saturation: 100 %
O2 Saturation: 100 %
Patient temperature: 36.3
Patient temperature: 37
Patient temperature: 37.2
Patient temperature: 37.3
Patient temperature: 37.1
Potassium: 5.1 mmol/L (ref 3.5–5.1)
Potassium: 6.3 mmol/L (ref 3.5–5.1)
Potassium: 4.6 mmol/L (ref 3.5–5.1)
Potassium: 4.8 mmol/L (ref 3.5–5.1)
Potassium: 5.5 mmol/L — ABNORMAL HIGH (ref 3.5–5.1)
Potassium: 3.4 mmol/L — ABNORMAL LOW (ref 3.5–5.1)
Potassium: 3.7 mmol/L (ref 3.5–5.1)
Potassium: 5.5 mmol/L — ABNORMAL HIGH (ref 3.5–5.1)
Potassium: 3.9 mmol/L (ref 3.5–5.1)
Potassium: 3.4 mmol/L — ABNORMAL LOW (ref 3.5–5.1)
Potassium: 3.3 mmol/L — ABNORMAL LOW (ref 3.5–5.1)
Potassium: 3.2 mmol/L — ABNORMAL LOW (ref 3.5–5.1)
Potassium: 3.3 mmol/L — ABNORMAL LOW (ref 3.5–5.1)
Potassium: 4.6 mmol/L (ref 3.5–5.1)
Sodium: 127 mmol/L — ABNORMAL LOW (ref 135–145)
Sodium: 131 mmol/L — ABNORMAL LOW (ref 135–145)
Sodium: 126 mmol/L — ABNORMAL LOW (ref 135–145)
Sodium: 133 mmol/L — ABNORMAL LOW (ref 135–145)
Sodium: 134 mmol/L — ABNORMAL LOW (ref 135–145)
Sodium: 139 mmol/L (ref 135–145)
Sodium: 140 mmol/L (ref 135–145)
Sodium: 139 mmol/L (ref 135–145)
Sodium: 142 mmol/L (ref 135–145)
Sodium: 143 mmol/L (ref 135–145)
Sodium: 143 mmol/L (ref 135–145)
Sodium: 142 mmol/L (ref 135–145)
Sodium: 143 mmol/L (ref 135–145)
Sodium: 141 mmol/L (ref 135–145)
TCO2: 17 mmol/L — ABNORMAL LOW (ref 22–32)
TCO2: 20 mmol/L — ABNORMAL LOW (ref 22–32)
TCO2: 16 mmol/L — ABNORMAL LOW (ref 22–32)
TCO2: 17 mmol/L — ABNORMAL LOW (ref 22–32)
TCO2: 19 mmol/L — ABNORMAL LOW (ref 22–32)
TCO2: 21 mmol/L — ABNORMAL LOW (ref 22–32)
TCO2: 19 mmol/L — ABNORMAL LOW (ref 22–32)
TCO2: 19 mmol/L — ABNORMAL LOW (ref 22–32)
TCO2: 16 mmol/L — ABNORMAL LOW (ref 22–32)
TCO2: 14 mmol/L — ABNORMAL LOW (ref 22–32)
TCO2: 18 mmol/L — ABNORMAL LOW (ref 22–32)
TCO2: 24 mmol/L (ref 22–32)
TCO2: 24 mmol/L (ref 22–32)
TCO2: 27 mmol/L (ref 22–32)
pCO2 arterial: 50.1 mmHg — ABNORMAL HIGH (ref 32–48)
pCO2 arterial: 54.9 mmHg — ABNORMAL HIGH (ref 32–48)
pCO2 arterial: 52.9 mmHg — ABNORMAL HIGH (ref 32–48)
pCO2 arterial: 57.8 mmHg — ABNORMAL HIGH (ref 32–48)
pCO2 arterial: 55.2 mmHg — ABNORMAL HIGH (ref 32–48)
pCO2 arterial: 52.8 mmHg — ABNORMAL HIGH (ref 32–48)
pCO2 arterial: 46.2 mmHg (ref 32–48)
pCO2 arterial: 45.6 mmHg (ref 32–48)
pCO2 arterial: 36.2 mmHg (ref 32–48)
pCO2 arterial: 29.1 mmHg — ABNORMAL LOW (ref 32–48)
pCO2 arterial: 36.4 mmHg (ref 32–48)
pCO2 arterial: 58.1 mmHg — ABNORMAL HIGH (ref 32–48)
pCO2 arterial: 53.9 mmHg — ABNORMAL HIGH (ref 32–48)
pCO2 arterial: 56 mmHg — ABNORMAL HIGH (ref 32–48)
pH, Arterial: 7.054 — CL (ref 7.35–7.45)
pH, Arterial: 7.168 — CL (ref 7.35–7.45)
pH, Arterial: 7.009 — CL (ref 7.35–7.45)
pH, Arterial: 7.065 — CL (ref 7.35–7.45)
pH, Arterial: 7.098 — CL (ref 7.35–7.45)
pH, Arterial: 7.163 — CL (ref 7.35–7.45)
pH, Arterial: 7.184 — CL (ref 7.35–7.45)
pH, Arterial: 7.205 — ABNORMAL LOW (ref 7.35–7.45)
pH, Arterial: 7.234 — ABNORMAL LOW (ref 7.35–7.45)
pH, Arterial: 7.252 — ABNORMAL LOW (ref 7.35–7.45)
pH, Arterial: 7.279 — ABNORMAL LOW (ref 7.35–7.45)
pH, Arterial: 7.196 — CL (ref 7.35–7.45)
pH, Arterial: 7.22 — ABNORMAL LOW (ref 7.35–7.45)
pH, Arterial: 7.264 — ABNORMAL LOW (ref 7.35–7.45)
pO2, Arterial: 45 mmHg — ABNORMAL LOW (ref 83–108)
pO2, Arterial: 49 mmHg — ABNORMAL LOW (ref 83–108)
pO2, Arterial: 52 mmHg — ABNORMAL LOW (ref 83–108)
pO2, Arterial: 53 mmHg — ABNORMAL LOW (ref 83–108)
pO2, Arterial: 84 mmHg (ref 83–108)
pO2, Arterial: 539 mmHg — ABNORMAL HIGH (ref 83–108)
pO2, Arterial: 550 mmHg — ABNORMAL HIGH (ref 83–108)
pO2, Arterial: 92 mmHg (ref 83–108)
pO2, Arterial: 64 mmHg — ABNORMAL LOW (ref 83–108)
pO2, Arterial: 105 mmHg (ref 83–108)
pO2, Arterial: 65 mmHg — ABNORMAL LOW (ref 83–108)
pO2, Arterial: 453 mmHg — ABNORMAL HIGH (ref 83–108)
pO2, Arterial: 447 mmHg — ABNORMAL HIGH (ref 83–108)
pO2, Arterial: 570 mmHg — ABNORMAL HIGH (ref 83–108)

## 2022-04-17 LAB — POCT I-STAT EG7
Acid-base deficit: 11 mmol/L — ABNORMAL HIGH (ref 0.0–2.0)
Acid-base deficit: 3 mmol/L — ABNORMAL HIGH (ref 0.0–2.0)
Acid-base deficit: 4 mmol/L — ABNORMAL HIGH (ref 0.0–2.0)
Acid-base deficit: 8 mmol/L — ABNORMAL HIGH (ref 0.0–2.0)
Acid-base deficit: 12 mmol/L — ABNORMAL HIGH (ref 0.0–2.0)
Bicarbonate: 18.6 mmol/L — ABNORMAL LOW (ref 20.0–28.0)
Bicarbonate: 28.1 mmol/L — ABNORMAL HIGH (ref 20.0–28.0)
Bicarbonate: 28.1 mmol/L — ABNORMAL HIGH (ref 20.0–28.0)
Bicarbonate: 20.5 mmol/L (ref 20.0–28.0)
Bicarbonate: 18.7 mmol/L — ABNORMAL LOW (ref 20.0–28.0)
Calcium, Ion: 1.04 mmol/L — ABNORMAL LOW (ref 1.15–1.40)
Calcium, Ion: 1.06 mmol/L — ABNORMAL LOW (ref 1.15–1.40)
Calcium, Ion: 1.06 mmol/L — ABNORMAL LOW (ref 1.15–1.40)
Calcium, Ion: 0.81 mmol/L — CL (ref 1.15–1.40)
Calcium, Ion: 0.88 mmol/L — CL (ref 1.15–1.40)
HCT: 40 % (ref 39.0–52.0)
HCT: 40 % (ref 39.0–52.0)
HCT: 41 % (ref 39.0–52.0)
HCT: 23 % — ABNORMAL LOW (ref 39.0–52.0)
HCT: 31 % — ABNORMAL LOW (ref 39.0–52.0)
Hemoglobin: 13.6 g/dL (ref 13.0–17.0)
Hemoglobin: 13.6 g/dL (ref 13.0–17.0)
Hemoglobin: 13.9 g/dL (ref 13.0–17.0)
Hemoglobin: 7.8 g/dL — ABNORMAL LOW (ref 13.0–17.0)
Hemoglobin: 10.5 g/dL — ABNORMAL LOW (ref 13.0–17.0)
O2 Saturation: 14 %
O2 Saturation: 14 %
O2 Saturation: 66 %
O2 Saturation: 52 %
O2 Saturation: 49 %
Potassium: 5.7 mmol/L — ABNORMAL HIGH (ref 3.5–5.1)
Potassium: 5.9 mmol/L — ABNORMAL HIGH (ref 3.5–5.1)
Potassium: 5.9 mmol/L — ABNORMAL HIGH (ref 3.5–5.1)
Potassium: 3.6 mmol/L (ref 3.5–5.1)
Potassium: 3.7 mmol/L (ref 3.5–5.1)
Sodium: 129 mmol/L — ABNORMAL LOW (ref 135–145)
Sodium: 134 mmol/L — ABNORMAL LOW (ref 135–145)
Sodium: 134 mmol/L — ABNORMAL LOW (ref 135–145)
Sodium: 140 mmol/L (ref 135–145)
Sodium: 141 mmol/L (ref 135–145)
TCO2: 20 mmol/L — ABNORMAL LOW (ref 22–32)
TCO2: 31 mmol/L (ref 22–32)
TCO2: 31 mmol/L (ref 22–32)
TCO2: 22 mmol/L (ref 22–32)
TCO2: 21 mmol/L — ABNORMAL LOW (ref 22–32)
pCO2, Ven: 55.8 mmHg (ref 44–60)
pCO2, Ven: 86.8 mmHg (ref 44–60)
pCO2, Ven: 87.1 mmHg (ref 44–60)
pCO2, Ven: 59.7 mmHg (ref 44–60)
pCO2, Ven: 65 mmHg — ABNORMAL HIGH (ref 44–60)
pH, Ven: 7.117 — CL (ref 7.25–7.43)
pH, Ven: 7.119 — CL (ref 7.25–7.43)
pH, Ven: 7.131 — CL (ref 7.25–7.43)
pH, Ven: 7.144 — CL (ref 7.25–7.43)
pH, Ven: 7.067 — CL (ref 7.25–7.43)
pO2, Ven: 17 mmHg — CL (ref 32–45)
pO2, Ven: 17 mmHg — CL (ref 32–45)
pO2, Ven: 46 mmHg — ABNORMAL HIGH (ref 32–45)
pO2, Ven: 36 mmHg (ref 32–45)
pO2, Ven: 38 mmHg (ref 32–45)

## 2022-04-17 LAB — PROTIME-INR
INR: 1 (ref 0.8–1.2)
INR: 1.6 — ABNORMAL HIGH (ref 0.8–1.2)
Prothrombin Time: 12.6 s (ref 11.4–15.2)
Prothrombin Time: 19.2 seconds — ABNORMAL HIGH (ref 11.4–15.2)

## 2022-04-17 LAB — CBC WITH DIFFERENTIAL/PLATELET
Abs Immature Granulocytes: 0.03 10*3/uL (ref 0.00–0.07)
Basophils Absolute: 0.1 10*3/uL (ref 0.0–0.1)
Basophils Relative: 1 %
Eosinophils Absolute: 0.2 10*3/uL (ref 0.0–0.5)
Eosinophils Relative: 2 %
HCT: 38.2 % — ABNORMAL LOW (ref 39.0–52.0)
Hemoglobin: 13 g/dL (ref 13.0–17.0)
Immature Granulocytes: 0 %
Lymphocytes Relative: 35 %
Lymphs Abs: 3.6 10*3/uL (ref 0.7–4.0)
MCH: 31.6 pg (ref 26.0–34.0)
MCHC: 34 g/dL (ref 30.0–36.0)
MCV: 92.7 fL (ref 80.0–100.0)
Monocytes Absolute: 0.9 10*3/uL (ref 0.1–1.0)
Monocytes Relative: 9 %
Neutro Abs: 5.4 10*3/uL (ref 1.7–7.7)
Neutrophils Relative %: 53 %
Platelets: 276 10*3/uL (ref 150–400)
RBC: 4.12 MIL/uL — ABNORMAL LOW (ref 4.22–5.81)
RDW: 13.4 % (ref 11.5–15.5)
WBC: 10.2 10*3/uL (ref 4.0–10.5)
nRBC: 0 % (ref 0.0–0.2)

## 2022-04-17 LAB — I-STAT CHEM 8, ED
BUN: 23 mg/dL (ref 8–23)
Calcium, Ion: 1.08 mmol/L — ABNORMAL LOW (ref 1.15–1.40)
Chloride: 103 mmol/L (ref 98–111)
Creatinine, Ser: 2.1 mg/dL — ABNORMAL HIGH (ref 0.61–1.24)
Glucose, Bld: 504 mg/dL (ref 70–99)
HCT: 44 % (ref 39.0–52.0)
Hemoglobin: 15 g/dL (ref 13.0–17.0)
Potassium: 4.7 mmol/L (ref 3.5–5.1)
Sodium: 133 mmol/L — ABNORMAL LOW (ref 135–145)
TCO2: 19 mmol/L — ABNORMAL LOW (ref 22–32)

## 2022-04-17 LAB — FIBRINOGEN: Fibrinogen: 187 mg/dL — ABNORMAL LOW (ref 210–475)

## 2022-04-17 LAB — CBC
HCT: 29.4 % — ABNORMAL LOW (ref 39.0–52.0)
HCT: 32.6 % — ABNORMAL LOW (ref 39.0–52.0)
Hemoglobin: 9.8 g/dL — ABNORMAL LOW (ref 13.0–17.0)
Hemoglobin: 10.9 g/dL — ABNORMAL LOW (ref 13.0–17.0)
MCH: 31.1 pg (ref 26.0–34.0)
MCH: 30.3 pg (ref 26.0–34.0)
MCHC: 33.3 g/dL (ref 30.0–36.0)
MCHC: 33.4 g/dL (ref 30.0–36.0)
MCV: 93.3 fL (ref 80.0–100.0)
MCV: 90.6 fL (ref 80.0–100.0)
Platelets: 162 10*3/uL (ref 150–400)
Platelets: 174 10*3/uL (ref 150–400)
RBC: 3.15 MIL/uL — ABNORMAL LOW (ref 4.22–5.81)
RBC: 3.6 MIL/uL — ABNORMAL LOW (ref 4.22–5.81)
RDW: 14 % (ref 11.5–15.5)
RDW: 15.1 % (ref 11.5–15.5)
WBC: 9.3 10*3/uL (ref 4.0–10.5)
WBC: 8.6 10*3/uL (ref 4.0–10.5)
nRBC: 0 % (ref 0.0–0.2)
nRBC: 0 % (ref 0.0–0.2)

## 2022-04-17 LAB — URINALYSIS, ROUTINE W REFLEX MICROSCOPIC
Bilirubin Urine: NEGATIVE
Glucose, UA: >=500 mg/dL — AB
Ketones, ur: NEGATIVE mg/dL
Leukocytes,Ua: NEGATIVE
Nitrite: NEGATIVE
Protein, ur: 30 mg/dL — AB
Specific Gravity, Urine: 1.041 — ABNORMAL HIGH (ref 1.005–1.030)
pH: 5 (ref 5.0–8.0)

## 2022-04-17 LAB — POCT ACTIVATED CLOTTING TIME
Activated Clotting Time: 209 seconds
Activated Clotting Time: 251 seconds
Activated Clotting Time: 269 seconds
Activated Clotting Time: 305 seconds
Activated Clotting Time: 257 seconds
Activated Clotting Time: 275 seconds
Activated Clotting Time: 257 seconds
Activated Clotting Time: 269 seconds
Activated Clotting Time: >1000 seconds
Activated Clotting Time: 245 seconds

## 2022-04-17 LAB — HEPATIC FUNCTION PANEL
ALT: 362 U/L — ABNORMAL HIGH (ref 0–44)
AST: 732 U/L — ABNORMAL HIGH (ref 15–41)
Albumin: 1.8 g/dL — ABNORMAL LOW (ref 3.5–5.0)
Alkaline Phosphatase: 38 U/L (ref 38–126)
Bilirubin, Direct: 0.1 mg/dL (ref 0.0–0.2)
Indirect Bilirubin: 0.6 mg/dL (ref 0.3–0.9)
Total Bilirubin: 0.7 mg/dL (ref 0.3–1.2)
Total Protein: 3.2 g/dL — ABNORMAL LOW (ref 6.5–8.1)

## 2022-04-17 LAB — MAGNESIUM: Magnesium: 1.7 mg/dL (ref 1.7–2.4)

## 2022-04-17 LAB — BASIC METABOLIC PANEL

## 2022-04-17 LAB — GLUCOSE, CAPILLARY
Glucose-Capillary: 504 mg/dL (ref 70–99)
Glucose-Capillary: 470 mg/dL — ABNORMAL HIGH (ref 70–99)
Glucose-Capillary: 501 mg/dL (ref 70–99)
Glucose-Capillary: 407 mg/dL — ABNORMAL HIGH (ref 70–99)
Glucose-Capillary: 368 mg/dL — ABNORMAL HIGH (ref 70–99)
Glucose-Capillary: 263 mg/dL — ABNORMAL HIGH (ref 70–99)

## 2022-04-17 LAB — BLOOD PRODUCT ORDER (VERBAL) VERIFICATION

## 2022-04-17 LAB — TROPONIN I (HIGH SENSITIVITY)
Troponin I (High Sensitivity): 99 ng/L — ABNORMAL HIGH (ref <18)
Troponin I (High Sensitivity): 1198 ng/L
Troponin I (High Sensitivity): >24000 ng/L (ref <18)

## 2022-04-17 LAB — RENAL FUNCTION PANEL
Albumin: 1.8 g/dL — ABNORMAL LOW (ref 3.5–5.0)
Anion gap: 21 — ABNORMAL HIGH (ref 5–15)
BUN: 21 mg/dL (ref 8–23)
CO2: 16 mmol/L — ABNORMAL LOW (ref 22–32)
Calcium: 6 mg/dL — CL (ref 8.9–10.3)
Chloride: 104 mmol/L (ref 98–111)
Creatinine, Ser: 2.52 mg/dL — ABNORMAL HIGH (ref 0.61–1.24)
GFR, Estimated: 28 mL/min — ABNORMAL LOW (ref >60)
Glucose, Bld: 552 mg/dL (ref 70–99)
Phosphorus: 7.7 mg/dL — ABNORMAL HIGH (ref 2.5–4.6)
Potassium: 4 mmol/L (ref 3.5–5.1)
Sodium: 141 mmol/L (ref 135–145)

## 2022-04-17 LAB — LACTIC ACID, PLASMA
Lactic Acid, Venous: >9 mmol/L (ref 0.5–1.9)
Lactic Acid, Venous: >9 mmol/L (ref 0.5–1.9)
Lactic Acid, Venous: >9 mmol/L (ref 0.5–1.9)

## 2022-04-17 LAB — COMPREHENSIVE METABOLIC PANEL
ALT: 36 U/L (ref 0–44)
AST: 36 U/L (ref 15–41)
Albumin: 3.6 g/dL (ref 3.5–5.0)
Alkaline Phosphatase: 77 U/L (ref 38–126)
Anion gap: 14 (ref 5–15)
BUN: 20 mg/dL (ref 8–23)
CO2: 20 mmol/L — ABNORMAL LOW (ref 22–32)
Calcium: 8.7 mg/dL — ABNORMAL LOW (ref 8.9–10.3)
Chloride: 101 mmol/L (ref 98–111)
Creatinine, Ser: 1.62 mg/dL — ABNORMAL HIGH (ref 0.61–1.24)
GFR, Estimated: 48 mL/min — ABNORMAL LOW (ref >60)
Glucose, Bld: 356 mg/dL — ABNORMAL HIGH (ref 70–99)
Potassium: 3.6 mmol/L (ref 3.5–5.1)
Sodium: 135 mmol/L (ref 135–145)
Total Bilirubin: 0.3 mg/dL (ref 0.3–1.2)
Total Protein: 6.7 g/dL (ref 6.5–8.1)

## 2022-04-17 LAB — APTT
aPTT: >200 seconds (ref 24–36)
aPTT: >200 seconds (ref 24–36)

## 2022-04-17 LAB — ECHOCARDIOGRAM LIMITED
Height: 68 in
Weight: 3856 oz

## 2022-04-17 LAB — LACTATE DEHYDROGENASE
LDH: 1268 U/L — ABNORMAL HIGH (ref 98–192)
LDH: 1973 U/L — ABNORMAL HIGH (ref 98–192)

## 2022-04-17 LAB — HEPARIN LEVEL (UNFRACTIONATED)
Heparin Unfractionated: >1.1 IU/mL — ABNORMAL HIGH (ref 0.30–0.70)
Heparin Unfractionated: >1.1 IU/mL — ABNORMAL HIGH (ref 0.30–0.70)

## 2022-04-17 LAB — MRSA NEXT GEN BY PCR, NASAL: MRSA by PCR Next Gen: NOT DETECTED

## 2022-04-17 LAB — ABO/RH: ABO/RH(D): O POS

## 2022-04-17 LAB — CBG MONITORING, ED: Glucose-Capillary: 325 mg/dL — ABNORMAL HIGH (ref 70–99)

## 2022-04-17 SURGERY — PACEMAKER IMPLANT

## 2022-04-17 SURGERY — TEMPORARY PACEMAKER
Anesthesia: LOCAL

## 2022-04-17 MED ORDER — SODIUM BICARBONATE 8.4 % IV SOLN
INTRAVENOUS | Status: AC
  Filled 2022-04-17: qty 50

## 2022-04-17 MED ORDER — SODIUM CHLORIDE 0.9 % IV BOLUS
1000.0000 mL | Freq: Once | INTRAVENOUS | Status: AC
  Administered 2022-04-17: 1000 mL via INTRAVENOUS

## 2022-04-17 MED ORDER — NOREPINEPHRINE 4 MG/250ML-% IV SOLN
0.0000 ug/min | INTRAVENOUS | Status: DC
  Administered 2022-04-17: 2 ug/min via INTRAVENOUS

## 2022-04-17 MED ORDER — CANGRELOR TETRASODIUM 50 MG IV SOLR
INTRAVENOUS | Status: AC
  Filled 2022-04-17: qty 50

## 2022-04-17 MED ORDER — SODIUM CHLORIDE 0.9% FLUSH
10.0000 mL | Freq: Two times a day (BID) | INTRAVENOUS | Status: DC
  Administered 2022-04-17: 20 mL
  Administered 2022-04-18 – 2022-04-21 (×4): 10 mL

## 2022-04-17 MED ORDER — SODIUM CHLORIDE 0.9% FLUSH
3.0000 mL | Freq: Two times a day (BID) | INTRAVENOUS | Status: DC
  Administered 2022-04-17 – 2022-04-21 (×7): 3 mL via INTRAVENOUS

## 2022-04-17 MED ORDER — SODIUM CHLORIDE 0.9% FLUSH
3.0000 mL | Freq: Two times a day (BID) | INTRAVENOUS | Status: DC
  Administered 2022-04-17: 3 mL via INTRAVENOUS

## 2022-04-17 MED ORDER — NOREPINEPHRINE 4 MG/250ML-% IV SOLN
INTRAVENOUS | Status: AC
  Filled 2022-04-17: qty 250

## 2022-04-17 MED ORDER — DOPAMINE-DEXTROSE 3.2-5 MG/ML-% IV SOLN
INTRAVENOUS | Status: DC | PRN
  Administered 2022-04-17: 9 ug/kg/min via INTRAVENOUS

## 2022-04-17 MED ORDER — VANCOMYCIN HCL IN DEXTROSE 1-5 GM/200ML-% IV SOLN
1000.0000 mg | Freq: Once | INTRAVENOUS | Status: AC
  Administered 2022-04-17: 1000 mg via INTRAVENOUS

## 2022-04-17 MED ORDER — CALCIUM CHLORIDE 10 % IV SOLN
INTRAVENOUS | Status: DC | PRN
  Administered 2022-04-17: 1 g via INTRAVENOUS

## 2022-04-17 MED ORDER — HEPARIN (PORCINE) IN NACL 1000-0.9 UT/500ML-% IV SOLN
INTRAVENOUS | Status: AC
  Filled 2022-04-17: qty 500

## 2022-04-17 MED ORDER — CANGRELOR BOLUS VIA INFUSION
INTRAVENOUS | Status: DC | PRN
  Administered 2022-04-17: 437.2 ug via INTRAVENOUS
  Administered 2022-04-17: 3279 ug via INTRAVENOUS

## 2022-04-17 MED ORDER — PANTOPRAZOLE SODIUM 40 MG IV SOLR: 40.0000 mg | Freq: Every day | INTRAVENOUS | Status: DC

## 2022-04-17 MED ORDER — FENTANYL BOLUS VIA INFUSION
INTRAVENOUS | Status: DC | PRN
  Administered 2022-04-17: 75 ug via INTRAVENOUS

## 2022-04-17 MED ORDER — SODIUM BICARBONATE 8.4 % IV SOLN
INTRAVENOUS | Status: DC
Start: 2022-04-17 — End: 2022-04-22
  Filled 2022-04-17 (×5): qty 25

## 2022-04-17 MED ORDER — DOBUTAMINE IN D5W 4-5 MG/ML-% IV SOLN
INTRAVENOUS | Status: AC
  Filled 2022-04-17: qty 250

## 2022-04-17 MED ORDER — SODIUM CHLORIDE 0.9 % IV SOLN: 250.0000 mL | INTRAVENOUS | Status: DC | PRN

## 2022-04-17 MED ORDER — AMIODARONE HCL IN DEXTROSE 360-4.14 MG/200ML-% IV SOLN
INTRAVENOUS | Status: AC
  Administered 2022-04-17: 60 mg/h via INTRAVENOUS
  Filled 2022-04-17: qty 200

## 2022-04-17 MED ORDER — DOBUTAMINE INFUSION FOR EP/ECHO/NUC (1000 MCG/ML)
INTRAVENOUS | Status: DC | PRN
  Administered 2022-04-17: 2.5 ug/kg/min via INTRAVENOUS

## 2022-04-17 MED ORDER — TICAGRELOR 90 MG PO TABS
180.0000 mg | ORAL_TABLET | Freq: Once | ORAL | Status: AC
  Administered 2022-04-17: 180 mg via ORAL
  Filled 2022-04-17: qty 2

## 2022-04-17 MED ORDER — PRISMASOL BGK 4/2.5 32-4-2.5 MEQ/L REPLACEMENT SOLN: Status: DC

## 2022-04-17 MED ORDER — CALCIUM GLUCONATE-NACL 1-0.675 GM/50ML-% IV SOLN
1.0000 g | Freq: Once | INTRAVENOUS | Status: AC
  Filled 2022-04-17: qty 50

## 2022-04-17 MED ORDER — VANCOMYCIN HCL IN DEXTROSE 1-5 GM/200ML-% IV SOLN
INTRAVENOUS | Status: AC
  Filled 2022-04-17: qty 200

## 2022-04-17 MED ORDER — MIDAZOLAM-SODIUM CHLORIDE 100-0.9 MG/100ML-% IV SOLN
INTRAVENOUS | Status: DC | PRN
  Administered 2022-04-17 (×2): 8 mg/h via INTRAVENOUS

## 2022-04-17 MED ORDER — HEPARIN SODIUM (PORCINE) 1000 UNIT/ML IJ SOLN
INTRAMUSCULAR | Status: DC | PRN
  Administered 2022-04-17: 5000 [IU] via INTRAVENOUS
  Administered 2022-04-17: 11000 [IU] via INTRAVENOUS
  Administered 2022-04-17: 3000 [IU] via INTRAVENOUS
  Administered 2022-04-17: 2000 [IU] via INTRAVENOUS
  Administered 2022-04-17: 3000 [IU] via INTRAVENOUS

## 2022-04-17 MED ORDER — NOREPINEPHRINE 4 MG/250ML-% IV SOLN
0.0000 ug/min | INTRAVENOUS | Status: DC
  Filled 2022-04-17: qty 250

## 2022-04-17 MED ORDER — SODIUM CHLORIDE 0.9 % IV SOLN: INTRAVENOUS | Status: DC

## 2022-04-17 MED ORDER — EPINEPHRINE HCL 5 MG/250ML IV SOLN IN NS
INTRAVENOUS | Status: DC | PRN
  Administered 2022-04-17: 15 ug/min via INTRAVENOUS

## 2022-04-17 MED ORDER — PANTOPRAZOLE SODIUM 40 MG IV SOLR
40.0000 mg | Freq: Every day | INTRAVENOUS | Status: DC
  Administered 2022-04-17 – 2022-04-20 (×4): 40 mg via INTRAVENOUS
  Filled 2022-04-17 (×4): qty 10

## 2022-04-17 MED ORDER — INSULIN REGULAR(HUMAN) IN NACL 100-0.9 UT/100ML-% IV SOLN
INTRAVENOUS | Status: DC
  Administered 2022-04-17: 19 [IU]/h via INTRAVENOUS
  Administered 2022-04-17: 13 [IU]/h via INTRAVENOUS
  Administered 2022-04-18: 5.5 [IU]/h via INTRAVENOUS
  Administered 2022-04-18: 13 [IU]/h via INTRAVENOUS
  Filled 2022-04-17 (×4): qty 100

## 2022-04-17 MED ORDER — LABETALOL HCL 5 MG/ML IV SOLN: 10.0000 mg | INTRAVENOUS | Status: AC | PRN

## 2022-04-17 MED ORDER — ORAL CARE MOUTH RINSE
15.0000 mL | OROMUCOSAL | Status: DC
Start: 2022-04-17 — End: 2022-04-21
  Administered 2022-04-17 – 2022-04-21 (×45): 15 mL via OROMUCOSAL

## 2022-04-17 MED ORDER — TICAGRELOR 90 MG PO TABS: 90.0000 mg | ORAL_TABLET | Freq: Two times a day (BID) | ORAL | Status: DC

## 2022-04-17 MED ORDER — ORAL CARE MOUTH RINSE: 15.0000 mL | OROMUCOSAL | Status: DC | PRN

## 2022-04-17 MED ORDER — DOPAMINE-DEXTROSE 3.2-5 MG/ML-% IV SOLN
0.0000 ug/kg/min | INTRAVENOUS | Status: DC
  Administered 2022-04-17: 5 ug/kg/min via INTRAVENOUS
  Filled 2022-04-17: qty 250

## 2022-04-17 MED ORDER — EPINEPHRINE HCL 5 MG/250ML IV SOLN IN NS
0.5000 ug/min | INTRAVENOUS | Status: DC
  Administered 2022-04-17: 10 ug/min via INTRAVENOUS
  Filled 2022-04-17 (×3): qty 250

## 2022-04-17 MED ORDER — SODIUM CHLORIDE 0.9 % IV SOLN
0.7500 ug/kg/min | INTRAVENOUS | Status: DC
  Filled 2022-04-17: qty 50

## 2022-04-17 MED ORDER — NOREPINEPHRINE 16 MG/250ML-% IV SOLN
0.0000 ug/min | INTRAVENOUS | Status: DC
  Administered 2022-04-17: 20 ug/min via INTRAVENOUS
  Administered 2022-04-18: 10 ug/min via INTRAVENOUS
  Administered 2022-04-19: 29 ug/min via INTRAVENOUS
  Administered 2022-04-19: 18 ug/min via INTRAVENOUS
  Administered 2022-04-20: 28 ug/min via INTRAVENOUS
  Administered 2022-04-20 – 2022-04-21 (×2): 20 ug/min via INTRAVENOUS
  Administered 2022-04-22: 6 ug/min via INTRAVENOUS
  Filled 2022-04-17 (×10): qty 250

## 2022-04-17 MED ORDER — TICAGRELOR 90 MG PO TABS
90.0000 mg | ORAL_TABLET | Freq: Two times a day (BID) | ORAL | Status: DC
Start: 2022-04-18 — End: 2022-04-22
  Administered 2022-04-18 – 2022-04-21 (×7): 90 mg
  Filled 2022-04-17 (×8): qty 1

## 2022-04-17 MED ORDER — STERILE WATER FOR INJECTION IV SOLN
INTRAVENOUS | Status: DC
  Filled 2022-04-17 (×4): qty 1000

## 2022-04-17 MED ORDER — SODIUM CHLORIDE 0.9% IV SOLUTION: Freq: Once | INTRAVENOUS | Status: AC

## 2022-04-17 MED ORDER — VASOPRESSIN 20 UNITS/100 ML INFUSION FOR SHOCK
0.0000 [IU]/min | INTRAVENOUS | Status: DC
  Administered 2022-04-17: 0.03 [IU]/min via INTRAVENOUS
  Administered 2022-04-18: 0.04 [IU]/min via INTRAVENOUS
  Administered 2022-04-19 (×2): 0.03 [IU]/min via INTRAVENOUS
  Administered 2022-04-20 (×2): 0.04 [IU]/min via INTRAVENOUS
  Administered 2022-04-20: 0.03 [IU]/min via INTRAVENOUS
  Administered 2022-04-21 (×2): 0.04 [IU]/min via INTRAVENOUS
  Filled 2022-04-17 (×11): qty 100

## 2022-04-17 MED ORDER — EPINEPHRINE 1 MG/10ML IJ SOSY
PREFILLED_SYRINGE | INTRAMUSCULAR | Status: DC | PRN
  Administered 2022-04-17 (×2): 1 mg via INTRAVENOUS

## 2022-04-17 MED ORDER — SODIUM BICARBONATE 8.4 % IV SOLN
INTRAVENOUS | Status: DC | PRN
  Administered 2022-04-17 (×7): 100 meq via INTRAVENOUS

## 2022-04-17 MED ORDER — ONDANSETRON HCL 4 MG/2ML IJ SOLN: 4.0000 mg | Freq: Four times a day (QID) | INTRAMUSCULAR | Status: DC | PRN

## 2022-04-17 MED ORDER — IPRATROPIUM-ALBUTEROL 0.5-2.5 (3) MG/3ML IN SOLN
3.0000 mL | RESPIRATORY_TRACT | Status: DC
  Administered 2022-04-17 – 2022-04-21 (×21): 3 mL via RESPIRATORY_TRACT
  Filled 2022-04-17: qty 3
  Filled 2022-04-17: qty 6
  Filled 2022-04-17 (×18): qty 3

## 2022-04-17 MED ORDER — FENTANYL BOLUS VIA INFUSION
50.0000 ug | INTRAVENOUS | Status: DC | PRN
  Administered 2022-04-17 (×3): 100 ug via INTRAVENOUS
  Administered 2022-04-20 (×2): 50 ug via INTRAVENOUS
  Administered 2022-04-20: 100 ug via INTRAVENOUS
  Administered 2022-04-21: 50 ug via INTRAVENOUS
  Administered 2022-04-21 (×2): 100 ug via INTRAVENOUS
  Administered 2022-04-21: 50 ug via INTRAVENOUS

## 2022-04-17 MED ORDER — SODIUM BICARBONATE 8.4 % IV SOLN
100.0000 meq | Freq: Once | INTRAVENOUS | Status: AC
Start: 2022-04-17 — End: 2022-04-17

## 2022-04-17 MED ORDER — ATROPINE SULFATE 1 MG/10ML IJ SOSY
1.0000 mg | PREFILLED_SYRINGE | Freq: Once | INTRAMUSCULAR | Status: AC
  Administered 2022-04-17: 1 mg via INTRAVENOUS
  Filled 2022-04-17: qty 10

## 2022-04-17 MED ORDER — ACETAMINOPHEN 325 MG PO TABS
650.0000 mg | ORAL_TABLET | ORAL | Status: DC | PRN
Start: 2022-04-17 — End: 2022-04-17

## 2022-04-17 MED ORDER — ALBUMIN HUMAN 25 % IV SOLN
12.5000 g | Freq: Once | INTRAVENOUS | Status: AC
  Administered 2022-04-17: 12.5 g via INTRAVENOUS
  Filled 2022-04-17: qty 50

## 2022-04-17 MED ORDER — ROCURONIUM BROMIDE 50 MG/5ML IV SOLN
100.0000 mg | Freq: Once | INTRAVENOUS | Status: AC
  Filled 2022-04-17: qty 10

## 2022-04-17 MED ORDER — HEPARIN (PORCINE) IN NACL 2000-0.9 UNIT/L-% IV SOLN
INTRAVENOUS | Status: AC
  Filled 2022-04-17: qty 1000

## 2022-04-17 MED ORDER — INSULIN ASPART 100 UNIT/ML IJ SOLN: 0.0000 [IU] | Freq: Every day | INTRAMUSCULAR | Status: DC

## 2022-04-17 MED ORDER — SODIUM CHLORIDE 0.9 % IV SOLN
0.0000 ug/kg/min | INTRAVENOUS | Status: DC
  Administered 2022-04-17: 2 ug/kg/min via INTRAVENOUS
  Administered 2022-04-17: 3 ug/kg/min via INTRAVENOUS
  Filled 2022-04-17 (×2): qty 20

## 2022-04-17 MED ORDER — AMIODARONE IV BOLUS ONLY 150 MG/100ML: 150.0000 mg | Freq: Once | INTRAVENOUS | Status: DC

## 2022-04-17 MED ORDER — SUCCINYLCHOLINE CHLORIDE 20 MG/ML IJ SOLN
INTRAMUSCULAR | Status: DC | PRN
  Administered 2022-04-17: 100 mg via INTRAVENOUS

## 2022-04-17 MED ORDER — CISATRACURIUM BOLUS VIA INFUSION
0.1000 mg/kg | Freq: Once | INTRAVENOUS | Status: DC
  Filled 2022-04-17: qty 11

## 2022-04-17 MED ORDER — ASPIRIN 81 MG PO CHEW: 81.0000 mg | CHEWABLE_TABLET | Freq: Every day | ORAL | Status: DC

## 2022-04-17 MED ORDER — MIDAZOLAM-SODIUM CHLORIDE 100-0.9 MG/100ML-% IV SOLN
INTRAVENOUS | Status: AC
  Filled 2022-04-17: qty 100

## 2022-04-17 MED ORDER — HEPARIN (PORCINE) 25000 UT/250ML-% IV SOLN
INTRAVENOUS | Status: DC | PRN
  Administered 2022-04-17: 1000 [IU]/h via INTRAVENOUS

## 2022-04-17 MED ORDER — ALBUMIN HUMAN 5 % IV SOLN
12.5000 g | INTRAVENOUS | Status: DC | PRN
  Administered 2022-04-17 – 2022-04-21 (×4): 12.5 g via INTRAVENOUS
  Filled 2022-04-17 (×4): qty 250

## 2022-04-17 MED ORDER — MIDAZOLAM HCL 2 MG/2ML IJ SOLN
2.0000 mg | INTRAMUSCULAR | Status: DC | PRN
Start: 2022-04-17 — End: 2022-04-22
  Administered 2022-04-20: 2 mg via INTRAVENOUS
  Filled 2022-04-17: qty 2

## 2022-04-17 MED ORDER — SODIUM BICARBONATE 8.4 % IV SOLN
100.0000 meq | Freq: Once | INTRAVENOUS | Status: AC
  Administered 2022-04-17: 100 meq via INTRAVENOUS
  Filled 2022-04-17: qty 100

## 2022-04-17 MED ORDER — AMIODARONE HCL IN DEXTROSE 360-4.14 MG/200ML-% IV SOLN
60.0000 mg/h | INTRAVENOUS | Status: DC
  Filled 2022-04-17: qty 400

## 2022-04-17 MED ORDER — MIDAZOLAM HCL 2 MG/2ML IJ SOLN
2.0000 mg | INTRAMUSCULAR | Status: AC | PRN
  Administered 2022-04-17 – 2022-04-20 (×3): 2 mg via INTRAVENOUS
  Filled 2022-04-17 (×2): qty 2

## 2022-04-17 MED ORDER — IOHEXOL 350 MG/ML SOLN
INTRAVENOUS | Status: DC | PRN
  Administered 2022-04-17: 150 mL

## 2022-04-17 MED ORDER — SODIUM CHLORIDE 0.9% FLUSH: 3.0000 mL | INTRAVENOUS | Status: DC | PRN

## 2022-04-17 MED ORDER — ROCURONIUM BROMIDE 10 MG/ML (PF) SYRINGE
PREFILLED_SYRINGE | INTRAVENOUS | Status: AC
  Administered 2022-04-17: 100 mg via INTRAVENOUS
  Filled 2022-04-17: qty 10

## 2022-04-17 MED ORDER — HEPARIN (PORCINE) 25000 UT/250ML-% IV SOLN
1200.0000 [IU]/h | INTRAVENOUS | Status: DC
  Administered 2022-04-17: 1200 [IU]/h via INTRAVENOUS
  Filled 2022-04-17 (×3): qty 250

## 2022-04-17 MED ORDER — NOREPINEPHRINE 4 MG/250ML-% IV SOLN
INTRAVENOUS | Status: AC
  Administered 2022-04-17: 15 ug/min via INTRAVENOUS
  Filled 2022-04-17: qty 250

## 2022-04-17 MED ORDER — FENTANYL CITRATE PF 50 MCG/ML IJ SOSY: 50.0000 ug | PREFILLED_SYRINGE | Freq: Once | INTRAMUSCULAR | Status: DC

## 2022-04-17 MED ORDER — AMIODARONE HCL IN DEXTROSE 360-4.14 MG/200ML-% IV SOLN
INTRAVENOUS | Status: AC
  Filled 2022-04-17: qty 200

## 2022-04-17 MED ORDER — INSULIN ASPART 100 UNIT/ML IJ SOLN: 0.0000 [IU] | Freq: Three times a day (TID) | INTRAMUSCULAR | Status: DC

## 2022-04-17 MED ORDER — AMIODARONE HCL IN DEXTROSE 360-4.14 MG/200ML-% IV SOLN
30.0000 mg/h | INTRAVENOUS | Status: DC
  Administered 2022-04-17: 30 mg/h via INTRAVENOUS

## 2022-04-17 MED ORDER — HEPARIN SODIUM (PORCINE) 1000 UNIT/ML IJ SOLN
INTRAMUSCULAR | Status: AC
  Filled 2022-04-17: qty 6

## 2022-04-17 MED ORDER — PRISMASOL BGK 4/2.5 32-4-2.5 MEQ/L EC SOLN: Status: DC

## 2022-04-17 MED ORDER — HYDRALAZINE HCL 20 MG/ML IJ SOLN: 10.0000 mg | INTRAMUSCULAR | Status: AC | PRN

## 2022-04-17 MED ORDER — MIDAZOLAM-SODIUM CHLORIDE 100-0.9 MG/100ML-% IV SOLN
0.0000 mg/h | INTRAVENOUS | Status: DC
  Administered 2022-04-17: 2 mg/h via INTRAVENOUS
  Administered 2022-04-18 (×2): 8 mg/h via INTRAVENOUS
  Administered 2022-04-19: 6 mg/h via INTRAVENOUS
  Administered 2022-04-19: 8 mg/h via INTRAVENOUS
  Administered 2022-04-20: 6 mg/h via INTRAVENOUS
  Administered 2022-04-20: 7 mg/h via INTRAVENOUS
  Administered 2022-04-21: 10 mg/h via INTRAVENOUS
  Administered 2022-04-21: 8 mg/h via INTRAVENOUS
  Filled 2022-04-17 (×10): qty 100

## 2022-04-17 MED ORDER — INSULIN ASPART 100 UNIT/ML IJ SOLN: 0.0000 [IU] | INTRAMUSCULAR | Status: DC

## 2022-04-17 MED ORDER — ACETAMINOPHEN 160 MG/5ML PO SOLN: 650.0000 mg | ORAL | Status: DC | PRN

## 2022-04-17 MED ORDER — SODIUM CHLORIDE 0.9 % IV SOLN
1.0000 g | Freq: Three times a day (TID) | INTRAVENOUS | Status: DC
  Administered 2022-04-17 – 2022-04-21 (×12): 1 g via INTRAVENOUS
  Filled 2022-04-17 (×15): qty 20

## 2022-04-17 MED ORDER — CALCIUM GLUCONATE-NACL 2-0.675 GM/100ML-% IV SOLN
2.0000 g | Freq: Once | INTRAVENOUS | Status: AC
  Administered 2022-04-17: 2000 mg via INTRAVENOUS
  Filled 2022-04-17: qty 100

## 2022-04-17 MED ORDER — SODIUM BICARBONATE 8.4 % IV SOLN
INTRAVENOUS | Status: DC | PRN
  Administered 2022-04-17 (×2): 100 meq via INTRAVENOUS

## 2022-04-17 MED ORDER — HEPARIN (PORCINE) IN NACL 1000-0.9 UT/500ML-% IV SOLN
INTRAVENOUS | Status: DC | PRN
  Administered 2022-04-17 (×3): 500 mL

## 2022-04-17 MED ORDER — NITROGLYCERIN 1 MG/10 ML FOR IR/CATH LAB
INTRA_ARTERIAL | Status: AC
  Filled 2022-04-17: qty 10

## 2022-04-17 MED ORDER — NOREPINEPHRINE 4 MG/250ML-% IV SOLN
INTRAVENOUS | Status: DC | PRN
  Administered 2022-04-17: 50 ug/min via INTRAVENOUS

## 2022-04-17 MED ORDER — CHLORHEXIDINE GLUCONATE CLOTH 2 % EX PADS
6.0000 | MEDICATED_PAD | Freq: Every day | CUTANEOUS | Status: DC
  Administered 2022-04-18 – 2022-04-21 (×5): 6 via TOPICAL

## 2022-04-17 MED ORDER — FENTANYL CITRATE (PF) 100 MCG/2ML IJ SOLN
INTRAMUSCULAR | Status: DC | PRN
  Administered 2022-04-17: 50 ug via INTRAVENOUS
  Administered 2022-04-17: 75 ug via INTRAVENOUS

## 2022-04-17 MED ORDER — CALCIUM GLUCONATE-NACL 1-0.675 GM/50ML-% IV SOLN
1.0000 g | Freq: Once | INTRAVENOUS | Status: AC
Start: 2022-04-17 — End: 2022-04-17
  Administered 2022-04-17: 1000 mg via INTRAVENOUS
  Filled 2022-04-17: qty 50

## 2022-04-17 MED ORDER — ASPIRIN 81 MG PO CHEW: 81.0000 mg | CHEWABLE_TABLET | ORAL | Status: DC

## 2022-04-17 MED ORDER — EPINEPHRINE 1 MG/10ML IJ SOSY
PREFILLED_SYRINGE | INTRAMUSCULAR | Status: AC
  Filled 2022-04-17: qty 10

## 2022-04-17 MED ORDER — FENTANYL 2500MCG IN NS 250ML (10MCG/ML) PREMIX INFUSION
50.0000 ug/h | INTRAVENOUS | Status: DC
  Administered 2022-04-17: 100 ug/h via INTRAVENOUS
  Administered 2022-04-17: 50 ug/h via INTRAVENOUS
  Administered 2022-04-18: 200 ug/h via INTRAVENOUS
  Administered 2022-04-19 – 2022-04-20 (×4): 250 ug/h via INTRAVENOUS
  Administered 2022-04-20: 275 ug/h via INTRAVENOUS
  Administered 2022-04-20: 300 ug/h via INTRAVENOUS
  Administered 2022-04-21: 250 ug/h via INTRAVENOUS
  Administered 2022-04-21: 300 ug/h via INTRAVENOUS
  Administered 2022-04-21: 250 ug/h via INTRAVENOUS
  Filled 2022-04-17 (×14): qty 250

## 2022-04-17 MED ORDER — LIDOCAINE HCL (PF) 1 % IJ SOLN
INTRAMUSCULAR | Status: DC | PRN
  Administered 2022-04-17: 30 mL via INTRADERMAL

## 2022-04-17 MED ORDER — SODIUM CHLORIDE 0.9% FLUSH: 10.0000 mL | INTRAVENOUS | Status: DC | PRN

## 2022-04-17 MED ORDER — SODIUM CHLORIDE 0.9 % FOR CRRT: INTRAVENOUS_CENTRAL | Status: DC | PRN

## 2022-04-17 MED ORDER — ARTIFICIAL TEARS OPHTHALMIC OINT
1.0000 | TOPICAL_OINTMENT | Freq: Three times a day (TID) | OPHTHALMIC | Status: DC
  Administered 2022-04-17 – 2022-04-19 (×5): 1 via OPHTHALMIC
  Filled 2022-04-17: qty 3.5

## 2022-04-17 MED ORDER — MIDAZOLAM BOLUS VIA INFUSION
0.0000 mg | INTRAVENOUS | Status: DC | PRN
  Administered 2022-04-20 – 2022-04-21 (×3): 2 mg via INTRAVENOUS
  Administered 2022-04-21: 1 mg via INTRAVENOUS

## 2022-04-17 MED ORDER — DEXTROSE 50 % IV SOLN: 0.0000 mL | INTRAVENOUS | Status: DC | PRN

## 2022-04-17 MED ORDER — LIDOCAINE HCL (PF) 1 % IJ SOLN
INTRAMUSCULAR | Status: AC
  Filled 2022-04-17: qty 30

## 2022-04-17 MED ORDER — ETOMIDATE 2 MG/ML IV SOLN
INTRAVENOUS | Status: DC | PRN
  Administered 2022-04-17: 20 mg via INTRAVENOUS

## 2022-04-17 MED ORDER — HEPARIN SODIUM (PORCINE) 1000 UNIT/ML DIALYSIS
1000.0000 [IU] | INTRAMUSCULAR | Status: DC | PRN
  Administered 2022-04-21: 4000 [IU] via INTRAVENOUS_CENTRAL
  Administered 2022-04-21: 2800 [IU] via INTRAVENOUS_CENTRAL
  Filled 2022-04-17: qty 6
  Filled 2022-04-17: qty 1
  Filled 2022-04-17: qty 6

## 2022-04-17 MED ORDER — SODIUM CHLORIDE 0.9 % IV SOLN
0.5000 ug/min | INTRAVENOUS | Status: DC
  Administered 2022-04-17 – 2022-04-18 (×2): 9 ug/min via INTRAVENOUS
  Administered 2022-04-19: 5 ug/min via INTRAVENOUS
  Administered 2022-04-21: 4 ug/min via INTRAVENOUS
  Administered 2022-04-22: 40 ug/min via INTRAVENOUS
  Filled 2022-04-17 (×7): qty 10

## 2022-04-17 MED ORDER — MIDAZOLAM HCL 2 MG/2ML IJ SOLN
INTRAMUSCULAR | Status: DC | PRN
  Administered 2022-04-17: 8 mg via INTRAVENOUS
  Administered 2022-04-17: 2 mg via INTRAVENOUS

## 2022-04-17 MED ORDER — DEXTROSE-NACL 5-0.45 % IV SOLN: INTRAVENOUS | Status: DC

## 2022-04-17 MED ORDER — PERFLUTREN LIPID MICROSPHERE
1.0000 mL | INTRAVENOUS | Status: AC | PRN
  Administered 2022-04-17: 2 mL via INTRAVENOUS

## 2022-04-17 MED ORDER — SODIUM CHLORIDE 0.9 % IV SOLN: INTRAVENOUS | Status: DC | PRN

## 2022-04-17 MED ORDER — HEPARIN SODIUM (PORCINE) 1000 UNIT/ML IJ SOLN
INTRAMUSCULAR | Status: AC
  Filled 2022-04-17: qty 10

## 2022-04-17 MED ORDER — CHLORHEXIDINE GLUCONATE CLOTH 2 % EX PADS: 6.0000 | MEDICATED_PAD | Freq: Once | CUTANEOUS | Status: DC

## 2022-04-17 SURGICAL SUPPLY — 37 items
BALL SAPPHIRE NC24 4.0X15 (BALLOONS) ×3
BALLN SAPPHIRE 2.0X12 (BALLOONS) ×3
BALLOON SAPPHIRE 2.0X12 (BALLOONS) IMPLANT
BALLOON SAPPHIRE NC24 4.0X15 (BALLOONS) IMPLANT
CANNULA BIOMEDICUS 19FR SINGLE (MISCELLANEOUS) ×1 IMPLANT
CANNULA FEM BIOMEDICUS 25FR (CANNULA) ×1 IMPLANT
CATH INFINITI 5FR MULTPACK ANG (CATHETERS) ×1 IMPLANT
CATH INFINITI JR4 5F (CATHETERS) ×1 IMPLANT
CATH SWAN GANZ 7F STRAIGHT (CATHETERS) ×1 IMPLANT
CATH VISTA GUIDE 6FR XBLAD3.0 (CATHETERS) ×1 IMPLANT
GUIDEWIRE ANGLED .035X150CM (WIRE) ×1 IMPLANT
GUIDEWIRE SAFE TJ AMPLATZ EXST (WIRE) ×3 IMPLANT
HEMOSTAT KELLY 5.5 SS STRL (MISCELLANEOUS) ×1 IMPLANT
KIT DILATOR VASC 18G NDL (KITS) ×1 IMPLANT
KIT ENCORE 26 ADVANTAGE (KITS) ×1 IMPLANT
KIT HEART LEFT (KITS) ×3 IMPLANT
KIT MICROPUNCTURE NIT STIFF (SHEATH) ×4 IMPLANT
PACK CARDIAC CATHETERIZATION (CUSTOM PROCEDURE TRAY) ×3 IMPLANT
PINNACLE LONG 6F 25CM (SHEATH) ×3
SET IMPELLA CP PUMP (CATHETERS) ×1 IMPLANT
SHEATH INTRO PINNACLE 6F 25CM (SHEATH) IMPLANT
SHEATH PINNACLE 5F 10CM (SHEATH) ×2 IMPLANT
SHEATH PINNACLE 6F 10CM (SHEATH) ×3 IMPLANT
SHEATH PINNACLE 7F 10CM (SHEATH) ×1 IMPLANT
SHEATH PROBE COVER 6X72 (BAG) ×1 IMPLANT
SLEEVE REPOSITIONING LENGTH 30 (MISCELLANEOUS) ×2 IMPLANT
STENT SYNERGY XD 3.50X16 (Permanent Stent) IMPLANT
STENT SYNERGY XD 3.50X38 (Permanent Stent) IMPLANT
SYNERGY XD 3.50X16 (Permanent Stent) ×3 IMPLANT
SYNERGY XD 3.50X38 (Permanent Stent) ×3 IMPLANT
TRANSDUCER W/STOPCOCK (MISCELLANEOUS) ×3 IMPLANT
TUBING CIL FLEX 10 FLL-RA (TUBING) ×3 IMPLANT
WIRE ASAHI PROWATER 180CM (WIRE) ×1 IMPLANT
WIRE EMERALD 3MM-J .035X150CM (WIRE) ×1 IMPLANT
WIRE EMERALD 3MM-J .035X260CM (WIRE) ×2 IMPLANT
WIRE HI TORQ VERSACORE-J 145CM (WIRE) ×1 IMPLANT
WIRE MICROINTRODUCER 60CM (WIRE) ×6 IMPLANT

## 2022-04-17 NOTE — Consult Note (Addendum)
[facility] Renal Consultation Note  Requesting MD: Marca Ancona, MD Indication for Consultation:  AKI and shock   Chief complaint: s/p arrest and for ECMO  HPI:  James White is a 61 y.o. male with a history of hypertension, DM, and hyperlipidemia who presented to the hospital after a syncopal event at work; EMS was called to the scene and brought him to [facility] per charting.  He was transferred to [facility].  He had felt poorly all weekend per charting but attributed it to the heat.  He was found to have had a STEMI and complete heart block.  He was taken to the cath lab.  His course was complicated by PEA arrest and he had Impella placed and underwent ECMO cannulation.  He bled out from the site of ECMO cannulation.  Vascular surgery was consulted and after discussion of risks/benefits with cardiology they did not feel pt would survive ECMO cannula removal.  Better hemostasis was achieved with temporary bolsters at the bedside per charting.  History is obtained via chart review as he is intubated and sedated and paralyzed.  At the time of my exam levo is running at 15 mcg/min, epi is running at 10 mcg/min, and they are starting vasopressin.  He is also on an amiodarone drip. He is s/p PRBC's and FFP.   I called his sister via phone-she is his closest relative.  She confirmed that he does not have a spouse or children or living parent.  She is his only living sibling.  She states that he would want dialysis if indicated.  We discussed the risks benefits and indications for renal replacement therapy and she does provide consent for renal replacement therapy.    Creatinine, Ser  Date/Time Value Ref Range Status  04/18/2022 10:20 AM 2.10 (H) 0.61 - 1.24 mg/dL Final  27/25/3664 40:34 AM 1.62 (H) 0.61 - 1.24 mg/dL Final     PMHx:   Past Medical History:  Diagnosis Date   High cholesterol    Hypertension   DM  Past Surgical History:  Procedure Laterality Date   KNEE SURGERY       Family Hx: History reviewed. No pertinent family history. Unable to obtain secondary to intubated and sedated   Social History:  reports that he has never smoked. He does not have any smokeless tobacco history on file. He reports that he does not drink alcohol and does not use drugs. Per chart review   Allergies: No Known Allergies  Medications: Prior to Admission medications   Medication Sig Start Date End Date Taking? Authorizing Provider  acyclovir (ZOVIRAX) 400 MG tablet Take 2 tablets (800 mg total) by mouth 5 (five) times daily. 05/04/13   Geoffery Lyons, MD  HYDROcodone-acetaminophen (NORCO) 5-325 MG per tablet Take 2 tablets by mouth every 4 (four) hours as needed for pain. 05/04/13   Geoffery Lyons, MD  Olmesartan-Amlodipine-HCTZ (TRIBENZOR PO) Take by mouth.    [provider]  predniSONE (DELTASONE) 10 MG tablet Take 2 tablets (20 mg total) by mouth 2 (two) times daily. 05/04/13   Geoffery Lyons, MD    I have reviewed the patient's current and reported prior to admission medications.  Labs:     Latest Ref Rng & Units 04/14/2022    6:12 PM 05/05/2022    5:14 PM 04/16/2022    3:56 PM  BMP  Sodium 135 - 145 mmol/L 142  139  140   Potassium 3.5 - 5.1 mmol/L 3.9  5.5  3.7     Urinalysis No results found for: "COLORURINE", "APPEARANCEUR", "LABSPEC", "PHURINE", "GLUCOSEU", "HGBUR", "BILIRUBINUR", "KETONESUR", "PROTEINUR", "UROBILINOGEN", "NITRITE", "LEUKOCYTESUR"   ROS:  Unable to obtain 2/2 intubated and sedated   Physical Exam: Vitals:   04/29/2022 1317 04/14/2022 1440  BP:    Pulse:    Resp:    Temp:    SpO2: (!) 78% 95%     General:  adult male critically ill   HEENT: NCAT Eyes: closed and sedated  Neck: increased circumference; trachea midline Heart: paced Lungs: intubated and on ECMO Abdomen: obese and distended; soft Extremities: trace to 1+ edema Skin: dried blood on groin  Neuro: sedative and paralytic running Vascular - ECMO catheters in  place  Assessment/Plan:  # AKI  - CRRT orders are in place  - 4K fluids - Start with UF keep even as tolerated then advance to net neg 50-100 ml/hr as tolerated.  Spoke with critical care.  Unable to easily connect to ECMO circuit so when able/stable they will place access for CRRT   # PEA arrest - post-arrest supportive care per cardiology and critical care  # Complete heart block  - s/p temp pacer  # Cardiogenic shock  - pressors per critical care   # Anemia - acute blood loss  - s/p PRBC's and had bled out from the site of ECMO cannulation. Now better hemostasis achieved s/p vascular consult - PRBC's per primary team   # Acute MI - s/p PCI with cardiology on 7/10  # Acute hypoxic respiratory failure - on vent per critical care and on ecmo  # Metabolic acidosis - on bicarb gtt and starting CRRT  # Hypocalcemia - ordered repletion  - continue to monitor and note on bicarb   Thank you for the consult.  Please do not hesitate to contact me with any questions regarding our patient.   Estanislado Emms 04/16/2022, 6:56 PM

## 2022-04-17 NOTE — Progress Notes (Signed)
Called for low Bps, high venous pressures on ECMO circuit.  BP (!) 67/33   Pulse 80   Temp 98.8 F (37.1 C)   Resp 15   Ht 5\' 8"  (1.727 m)   Wt 109.3 kg   SpO2 100%   BMI 36.64 kg/m   Vent set at 15/ PS 15/ PEEP 10, 40% with Vt ~70cc on MV.  ABG    Component Value Date/Time   PHART 7.196 (LL) 2022/05/14 2107   PCO2ART 58.1 (H) 05-14-2022 2107   PO2ART 453 (H) 05-14-2022 2107   HCO3 22.4 2022/05/14 2107   TCO2 24 05-14-22 2107   ACIDBASEDEF 6.0 (H) 05/14/22 2107   O2SAT 100 05-14-22 2107     PS increased to 20 with increase I-time to 0.75 with Vt now  ~280-310 with Mv ~7L/min to address respiratory acidosis. Additional 1g Ca+ ordered. Mild air trapping on vent- starting duonebs Q4h. Pplat 29.  Repeat ABG in 1 hr.  1 unit pRBCs given with improvement in venous pressures.  P ven now in low 90s, previously ~110. P internal remains high, ~270, but on heparin and no clots seen in oxygenator. Oxygenation has been excellent based on RUE ABGs. 3500 RPM, 3.6L/min flows with MAP in low 70s.   Adding meropenem for presumed aspiration pneumonia.  CC time: 30 min.  2108, DO 05/14/2022 10:06 PM [facility]

## 2022-04-17 NOTE — Progress Notes (Signed)
   2022-04-28 1332  Clinical Encounter Type  Visited With Patient not available  Visit Type Code  Referral From Nurse  Consult/Referral To Chaplain   Chaplain responded to a code blue while patient, James White was in the cath lab. No family present at this time.  If chaplain is requested someone will return.  Valerie Roys [facility]  [phone]

## 2022-04-17 NOTE — Progress Notes (Signed)
CCM ECMO evening rounds  61 year old man who is critically ill due to cardiogenic shock requiring initiation of VA ECMO following anterior STEMI.  On arrival in ICU patient developed VT with loss of pressure. DC x 1 to SR but lost pulsatility. Profuse bleeding from arterial cannulation site.   Pressure held. ACT 600 and venous alarm on ECMO. Given 2 Units PRBC, PLT and FFP.   Started on vasopressin ventilator increased to emergency settings.   Rhythm stabilized with increased BP and volume.  Buttressing sutures placed by VVS.  - Allow stabilization, then wean ventilator back to rest settings.  - Pressure dressing to left groin. Paralytic to prevent movement overnight.  - Start CRRT.   CRITICAL CARE Performed by: Lynnell Catalan   Total critical care time: 60 minutes  Critical care time was exclusive of separately billable procedures and treating other patients.  Critical care was necessary to treat or prevent imminent or life-threatening deterioration.  Critical care was time spent personally by me on the following activities: development of treatment plan with patient and/or surrogate as well as nursing, discussions with consultants, evaluation of patient's response to treatment, examination of patient, obtaining history from patient or surrogate, ordering and performing treatments and interventions, ordering and review of laboratory studies, ordering and review of radiographic studies, pulse oximetry, re-evaluation of patient's condition and participation in multidisciplinary rounds.  Lynnell Catalan, MD [facility] ICU Physician [facility]  Pager: [phone] Mobile: [phone] After hours: [phone]. [phone]

## 2022-04-17 NOTE — Progress Notes (Signed)
Changes made to ventilator settings verbally by Dr. Katrinka Blazing while in cath lab.

## 2022-04-17 NOTE — Progress Notes (Signed)
Pharmacy Antibiotic Note  James White is a 61 y.o. male admitted on 05/03/2022 with  cardiac arrest requiring ECMO .  Pharmacy has been consulted for meropenem dosing.  Patient with complicated cath with eventual ECMO deployment. Tmax 99.1, wbc normal at 8.6. Concern for aspiration pneumonia, will start empiric meropenem. Of note he received 1g of vancomycin earlier today for surgical prophylaxis. He was started on crrt this evening.   Plan: Meropenem 1g q8 hours while on ecmo/crrt  Height: 5\' 8"  (172.7 cm) Weight: 109.3 kg (241 lb) IBW/kg (Calculated) : 68.4  Temp (24hrs), Avg:98 F (36.7 C), Min:96.3 F (35.7 C), Max:99.1 F (37.3 C)  Recent Labs  Lab 04/09/2022 0730 04/20/2022 1020 04/25/2022 1333 04/18/2022 1741 04/23/2022 2106  WBC 10.2  --   --  9.3 8.6  CREATININE 1.62* 2.10*  --  DUPLICATE REQUEST  2.52*  --   LATICACIDVEN  --   --  >9.0* >9.0* >9.0*    Estimated Creatinine Clearance: 36.9 mL/min (A) (by C-G formula based on SCr of 2.52 mg/dL (H)).    No Known Allergies  Thank you for allowing pharmacy to be a part of this patient's care.  06/18/22 PharmD., BCPS Clinical Pharmacist 05/01/2022 10:46 PM

## 2022-04-17 NOTE — Progress Notes (Signed)
ANTICOAGULATION CONSULT NOTE  Pharmacy Consult for heparin Indication:  ECMO + Impella  No Known Allergies  Patient Measurements: Height: 5\' 8"  (172.7 cm) Weight: 109.3 kg (241 lb) IBW/kg (Calculated) : 68.4 Heparin Dosing Weight: 93kg  Vital Signs: Temp: 99.1 F (37.3 C) (07/10 2200) BP: 65/62 (07/10 2135) Pulse Rate: 80 (07/10 2005)  Labs: Recent Labs    29-Apr-2022 0730 04-29-22 0934 04/29/2022 1020 April 29, 2022 1137 04-29-22 1741 2022/04/29 1812 04/29/22 2106 2022/04/29 2107 2022-04-29 2146  HGB 13.0  --  15.0   < > 9.8*   < > 10.9* 10.5* 11.2*  HCT 38.2*  --  44.0   < > 29.4*   < > 32.6* 31.0* 33.0*  PLT 276  --   --   --  162  --  174  --   --   APTT  --   --   --   --  >200*  --  >200*  --   --   LABPROT  --  12.6  --   --  19.2*  --   --   --   --   INR  --  1.0  --   --  1.6*  --   --   --   --   HEPARINUNFRC  --   --   --   --  >1.10*  --   --   --   --   CREATININE 1.62*  --  2.10*  --  DUPLICATE REQUEST  2.52*  --   --   --   --   TROPONINIHS 99* 1,198*  --   --   --   --  >24,000*  --   --    < > = values in this interval not displayed.     Estimated Creatinine Clearance: 36.9 mL/min (A) (by C-G formula based on SCr of 2.52 mg/dL (H)).   Medical History: Past Medical History:  Diagnosis Date   High cholesterol    Hypertension      Assessment: 23 yoM admitted with CHB and cardiogenic shock s/p Impella CP and ECMO cannulation. Pt started on heparin 1200 units/h in cath lab for anticoagulation, pharmacy asked to manage.  Patient with sodium bicarb for impella purge. CRRT running without heparin syringe. Aptt >200s and heparin level >1.1. Hemoglobin appear stable at 11. ACT did get down to 245 earlier this evening.   Unclear why coags are so elevated. Instructed nurse to hold heparin for an hour and recheck.   Goal of Therapy:  Heparin level 0.3-0.5 units/ml Monitor platelets by anticoagulation protocol: Yes   Plan:  Hold heparin for 1 hour and recheck  coags to determine when to restart heparin.  Will restart at much lower dose   77 PharmD., BCPS Clinical Pharmacist 04-29-2022 10:28 PM

## 2022-04-17 NOTE — Progress Notes (Signed)
ANTICOAGULATION CONSULT NOTE - Initial Consult  Pharmacy Consult for heparin Indication:  ECMO + Impella  No Known Allergies  Patient Measurements: Height: 5\' 8"  (172.7 cm) Weight: 109.3 kg (241 lb) IBW/kg (Calculated) : 68.4 Heparin Dosing Weight: 93kg  Vital Signs: Temp: 97.2 F (36.2 C) (07/10 0805) Temp Source: Oral (07/10 0805) BP: 68/52 (07/10 1000) Pulse Rate: 70 (07/10 1000)  Labs: Recent Labs    04/23/2022 0730 04/11/2022 0934 05/01/2022 1020 04/26/2022 1144 04/23/2022 1150 04/26/2022 1251  HGB 13.0  --  15.0 13.6 13.6 12.9*  HCT 38.2*  --  44.0 40.0 40.0 38.0*  PLT 276  --   --   --   --   --   LABPROT  --  12.6  --   --   --   --   INR  --  1.0  --   --   --   --   CREATININE 1.62*  --  2.10*  --   --   --   TROPONINIHS 99* 1,198*  --   --   --   --     Estimated Creatinine Clearance: 44.3 mL/min (A) (by C-G formula based on SCr of 2.1 mg/dL (H)).   Medical History: Past Medical History:  Diagnosis Date   High cholesterol    Hypertension      Assessment: 16 yoM admitted with CHB and cardiogenic shock s/p Impella CP and ECMO cannulation. Pt started on heparin 1200 units/h in cath lab for anticoagulation, pharmacy asked to manage.  Goal of Therapy:  Heparin level 0.3-0.5 units/ml Monitor platelets by anticoagulation protocol: Yes   Plan:  Heparin 1200 units/h for now Will F/U Impella purge plan with MD  77, PharmD, BCPS, [facility] Clinical Pharmacist [phone] Please check AMION for all [facility] Pharmacy numbers 04/20/2022

## 2022-04-17 NOTE — ED Provider Notes (Addendum)
CRITICAL CARE Performed by: Vanetta Mulders Total critical care time: 35 minutes Critical care time was exclusive of separately billable procedures and treating other patients. Critical care was necessary to treat or prevent imminent or life-threatening deterioration. Critical care was time spent personally by me on the following activities: development of treatment plan with patient and/or surrogate as well as nursing, discussions with consultants, evaluation of patient's response to treatment, examination of patient, obtaining history from patient or surrogate, ordering and performing treatments and interventions, ordering and review of laboratory studies, ordering and review of radiographic studies, pulse oximetry and re-evaluation of patient's condition.  Procedure Name: Intubation Date/Time: 04/11/2022 9:57 AM  Performed by: Vanetta Mulders, MDPre-anesthesia Checklist: Patient identified, Patient being monitored, Emergency Drugs available, Suction available and Timeout performed Oxygen Delivery Method: Ambu bag Preoxygenation: Pre-oxygenation with 100% oxygen Ventilation: Mask ventilation with difficulty Laryngoscope Size: Glidescope and 5 Tube size: 7.5 mm Number of attempts: 1 Airway Equipment and Method: Rigid stylet Placement Confirmation: ETT inserted through vocal cords under direct vision Secured at: 26 cm Difficulty Due To: Difficulty was anticipated and Difficult Airway- due to large tongue Comments: Difficulty secondary to a large tongue and we knew he was in pulmonary edema and there was lots of frothing fluid coming out of the lungs obscuring view but was able to successfully intubate.     Patient arrived in extremis from [facility].  Patient had bradycardia down and and temporary pacing had to be initiated upon arrival here [facility] initiated it coming down the hallway.  Patient's heart rate would vary.  He was on dopamine drip.  Blood pressures brought up by  that.  Right prior to intubation blood pressure was low in 70s had to go up on the dopamine drip.  Patient was doing a lot of coughing it was anticipated that this was due to pulmonary edema.  Seen by Dr. Ladona Ridgel from cardiology they are planning a temporary pacer wire.  In discussion with him we opted to intubate to control his airway.  His oxygen stats.  Intubation with some difficulties.  But was able to get it completed successfully patient with lots of pulmonary edema.  Dopamine was originally at 10 we went up on that to 15.  Patient doing better status post intubation.     Vanetta Mulders, MD 04/16/2022 [id number]    Vanetta Mulders, MD 04/29/2022 1001

## 2022-04-17 NOTE — Progress Notes (Addendum)
04/26/2022  Adjusted MV, pushing bicarb for acidemia.  Myrla Halsted MD PCCM  [phone] update: LAD DES x 2 placed with technical success but unfortunately patient went into PEA briefly after.  Remains in profound shock with inability to ventilate as well, will proceed with VA ECMO and impella vent, Dr. Alford Highland help appreciated.  Fem-Fem VA Goal: bridge to recovery Rest settings on vent once on pump Regional oximetry monitoring for north-south VM left for family, emergent conset  Additional 60 min cc time  Myrla Halsted MD PCCM

## 2022-04-17 NOTE — CV Procedure (Signed)
Procedure: VA ECMO cannulation  Indication: Cardiorespiratory failure in patient with acute MI, complete heart block, and acute CHF.  He initially had temporary transvenous pacemaker and Impella placed, then had PCI to LAD.  Immediately after PCI to LAD, he developed PEA arrest with transient CPR.  Decision then made to cannulate for VA ECMO.   Procedure:  The right and left groins were prepped and draped in sterile fashion.  Using serial dilations and modified Seldinger technique, a 23 F venous ECMO cannula was inserted in the right femoral vein to just below the right atrium.  Next, using serial dilation and modified Seldinger technique, a 19 F arterial ECMO cannula was inserted in the left femoral artery to the descending thoracic aorta.  There was some difficulty inserting the arterial cannula due to kinking of the wire. Using wet to wet connections, VA ECMO was initiated.  Using ultrasound guidance and micropuncture, the right and left SFAs were cannulated downstream of the RFA Impella and LFA arterial ECMO cannulas and 12F sheaths were placed for distal perfusion.  These distal perfusion catheters were Y'd into the ECMO circuit.    Marca Ancona 04/10/2022 5:59 PM

## 2022-04-17 NOTE — Procedures (Signed)
Central Venous Catheter Insertion Procedure Note  James White  [id number]  September 21, 1961  Date:05/04/2022  Time:1:11 PM   Provider Performing:Libero Puthoff Salena Saner Katrinka Blazing   Procedure: Insertion of Non-tunneled Central Venous Catheter(36556) with US guidance (10272)   Indication(s) Medication administration  Consent Unable to obtain consent due to emergent nature of procedure.  Anesthesia Topical only with 1% lidocaine   Timeout Verified patient identification, verified procedure, site/side was marked, verified correct patient position, special equipment/implants available, medications/allergies/relevant history reviewed, required imaging and test results available.  Sterile Technique Maximal sterile technique including full sterile barrier drape, hand hygiene, sterile gown, sterile gloves, mask, hair covering, sterile ultrasound probe cover (if used).  Procedure Description Area of catheter insertion was cleaned with chlorhexidine and draped in sterile fashion.  With real-time ultrasound guidance a central venous catheter was placed into the left internal jugular vein. Nonpulsatile blood flow and easy flushing noted in all ports.  The catheter was sutured in place and sterile dressing applied.  Complications/Tolerance None; patient tolerated the procedure well. Chest X-ray is ordered to verify placement for internal jugular or subclavian cannulation.   Chest x-ray is not ordered for femoral cannulation.  EBL Minimal  Specimen(s) None

## 2022-04-17 NOTE — Progress Notes (Signed)
Pt's ventilator settings changed to pediatric mode PCV for ECMO settings per MD.

## 2022-04-17 NOTE — Progress Notes (Signed)
Patient was stable in the cath lab.  Upon transfer to the CCU, the patient was noted to have profuse bleeding around the arterial ECMO cannula.  Pressure was held.  He is receiving 4 units PBCs and FFP.  Cangrelor was stopped after Ticagrelor was given. Patient went into VT and was shocked x 1 with resolution.  We attempted to wean from [facility] ECMO onto Impella but were unsuccessful.  Vascular surgery was consulted, a bolster was sutured around the left groin site with resolution of the bleeding for the time being.  If bleeding recurs, our only option is going to be removal of the arterial cannula from the left CFA with vascular repair and reconfiguration to right femoral vein/right IJ VV ECMO with Impella support.   CRITICAL CARE Performed by: Marca Ancona  Total critical care time: 50 minutes  Critical care time was exclusive of separately billable procedures and treating other patients.  Critical care was necessary to treat or prevent imminent or life-threatening deterioration.  Critical care was time spent personally by me on the following activities: development of treatment plan with patient and/or surrogate as well as nursing, discussions with consultants, evaluation of patient's response to treatment, examination of patient, obtaining history from patient or surrogate, ordering and performing treatments and interventions, ordering and review of laboratory studies, ordering and review of radiographic studies, pulse oximetry and re-evaluation of patient's condition.  Marca Ancona May 10, 2022 6:08 PM

## 2022-04-17 NOTE — Consult Note (Signed)
Hospital Consult    Reason for Consult: Bleeding from left femoral artery ECMO cannula Referring Physician: Cardiology and critical care MRN #:  [id number]  History of Present Illness: This is a 61 y.o. male with history of hypertension that presented with cardiogenic shock and heart block.  Ultimately he was taken emergently to the Cath Lab for temporary pacing as well as heart cath and Impella placement.  He suffered a PEA arrest and underwent VA ECMO cannulation.  In the left groin the arterial cannula was placed and apparently there was a small dissection noted with some resistance during cannula placement.  I was called after the patient was transported from the Cath Lab back to the ICU with arterial bleeding from the left groin around the sheath of the ECMO cannula.  Past Medical History:  Diagnosis Date   High cholesterol    Hypertension     Past Surgical History:  Procedure Laterality Date   KNEE SURGERY      No Known Allergies  Prior to Admission medications   Medication Sig Start Date End Date Taking? Authorizing Provider  acyclovir (ZOVIRAX) 400 MG tablet Take 2 tablets (800 mg total) by mouth 5 (five) times daily. 05/04/13   Geoffery Lyons, MD  HYDROcodone-acetaminophen (NORCO) 5-325 MG per tablet Take 2 tablets by mouth every 4 (four) hours as needed for pain. 05/04/13   Geoffery Lyons, MD  Olmesartan-Amlodipine-HCTZ (TRIBENZOR PO) Take by mouth.    [provider]  predniSONE (DELTASONE) 10 MG tablet Take 2 tablets (20 mg total) by mouth 2 (two) times daily. 05/04/13   Geoffery Lyons, MD    Social History   Socioeconomic History   Marital status: Single    Spouse name: Not on file   Number of children: Not on file   Years of education: Not on file   Highest education level: Not on file  Occupational History   Not on file  Tobacco Use   Smoking status: Never   Smokeless tobacco: Not on file  Substance and Sexual Activity   Alcohol use: No   Drug use: No    Sexual activity: Not on file  Other Topics Concern   Not on file  Social History Narrative   Not on file   Social Determinants of Health   Financial Resource Strain: Not on file  Food Insecurity: Not on file  Transportation Needs: Not on file  Physical Activity: Not on file  Stress: Not on file  Social Connections: Not on file  Intimate Partner Violence: Not on file   History reviewed. No pertinent family history.  ROS: [x]  Positive   [ ]  Negative   [ ]  All sytems reviewed and are negative  Cardiovascular: []  chest pain/pressure []  palpitations []  SOB lying flat []  DOE []  pain in legs while walking []  pain in legs at rest []  pain in legs at night []  non-healing ulcers []  hx of DVT []  swelling in legs  Pulmonary: []  productive cough []  asthma/wheezing []  home O2  Neurologic: []  weakness in []  arms []  legs []  numbness in []  arms []  legs []  hx of CVA []  mini stroke [] difficulty speaking or slurred speech []  temporary loss of vision in one eye []  dizziness  Hematologic: []  hx of cancer []  bleeding problems []  problems with blood clotting easily  Endocrine:   []  diabetes []  thyroid disease  GI []  vomiting blood []  blood in stool  GU: []  CKD/renal failure []  HD--[]  M/W/F or []  T/T/S []  burning with  urination []  blood in urine  Psychiatric: []  anxiety []  depression  Musculoskeletal: []  arthritis []  joint pain  Integumentary: []  rashes []  ulcers  Constitutional: []  fever []  chills   Physical Examination  Vitals:   05/02/2022 1317 04/16/2022 1440  BP:    Pulse:    Resp:    Temp:    SpO2: (!) 78% 95%   Body mass index is 36.64 kg/m.  General: Critically ill and intubated in the ICU, ECMO HENT: ET tube in place Cardiac: Paced rhythm Abdomen: Soft and obese Vascular Exam/Pulses: Left groin femoral artery ECMO cannula with brisk bleeding around the cannula but soft thigh with no large hematoma Right groin with venous ECMO cannula and  Impella Bilateral antegrade SFA sheaths Musculoskeletal: no muscle wasting or atrophy  Neurologic: GCS 3T  CBC    Component Value Date/Time   WBC 10.2 04/18/2022 0730   RBC 4.12 (L) 04/30/2022 0730   HGB 11.2 (L) 04/24/2022 1556   HCT 33.0 (L) 04/30/2022 1556   PLT 276 05/03/2022 0730   MCV 92.7 04/09/2022 0730   MCH 31.6 04/13/2022 0730   MCHC 34.0 04/21/2022 0730   RDW 13.4 04/09/2022 0730   LYMPHSABS 3.6 05/06/2022 0730   MONOABS 0.9 04/18/2022 0730   EOSABS 0.2 05/03/2022 0730   BASOSABS 0.1 05/07/2022 0730    BMET    Component Value Date/Time   NA 140 05/01/2022 1556   K 3.7 04/25/2022 1556   CL 103 04/25/2022 1020   CO2 20 (L) 04/12/2022 0730   GLUCOSE 504 (HH) 05/04/2022 1020   BUN 23 04/14/2022 1020   CREATININE 2.10 (H) 05/08/2022 1020   CALCIUM 8.7 (L) 04/18/2022 0730   GFRNONAA 48 (L) 05/03/2022 0730    COAGS: Lab Results  Component Value Date   INR 1.0 05/01/2022     Non-Invasive Vascular Imaging:    None   ASSESSMENT/PLAN: This is a 61 y.o. male with hx of hypertension that presented with cardiogenic shock and heart block.  Ultimately he was taken emergently to the Cath Lab for temporary pacing as well as heart cath and Impella placement.  He suffered a PEA arrest and underwent VA ECMO cannulation.  Ultimately I was called for bleeding from the left groin around the arterial ECMO cannula into the femoral artery.  I discussed taking the patient to the operating room for removal of the cannula and repair of the femoral artery and in discussion with cardiology they do not feel the patient would survive having the arterial ECMO cannula removed in the OR.  We subsequently placed temporary bolsters at the bedside using large 1-0 silk sutures around the left femoral sheath with much better hemostasis.  We will continue to monitor and will be high risk for ongoing bleeding.  He remains critically ill.  Vascular will follow.  06/18/2022, MD [facility] [place] [facility] Office: [phone]  06/18/2022

## 2022-04-17 NOTE — ED Provider Notes (Signed)
[facility] EMERGENCY DEPARTMENT Provider Note   CSN: [id number] Arrival date & time: 05/02/2022  0715     History  Chief Complaint  Patient presents with   Loss of Consciousness    James White is a 61 y.o. male.  Pt is a 61 yo male with a pmhx significant for htn, high cholesterol, and dm.  Pt has not been feeling well all weekend, but has continued to work.  Pt works at [employer] and stayed inside all weekend thinking he was not feeling well due to the heat.  He went to work this am and passed out.  He was diaphoretic for EMS and bp 76/57.  CBG 286.  An iv was started and pt was brought here.  Pt complains of feeling dizzy, but denies any cp.  Pt did take his bp (tribenzor) and his metformin this am before working.         Home Medications Prior to Admission medications   Medication Sig Start Date End Date Taking? Authorizing Provider  acyclovir (ZOVIRAX) 400 MG tablet Take 2 tablets (800 mg total) by mouth 5 (five) times daily. 05/04/13   Veryl Speak, MD  HYDROcodone-acetaminophen (NORCO) 5-325 MG per tablet Take 2 tablets by mouth every 4 (four) hours as needed for pain. 05/04/13   Veryl Speak, MD  Olmesartan-Amlodipine-HCTZ (TRIBENZOR PO) Take by mouth.    [provider]  predniSONE (DELTASONE) 10 MG tablet Take 2 tablets (20 mg total) by mouth 2 (two) times daily. 05/04/13   Veryl Speak, MD      Allergies    Patient has no known allergies.    Review of Systems   Review of Systems  Neurological:  Positive for light-headedness.  All other systems reviewed and are negative.   Physical Exam Updated Vital Signs BP (!) 79/63   Pulse 73   Temp (!) 97.2 F (36.2 C) (Oral)   Resp 17   Ht 5\' 8"  (1.727 m)   Wt 109.3 kg   SpO2 100%   BMI 36.64 kg/m  Physical Exam Vitals and nursing note reviewed.  Constitutional:      General: He is in acute distress.     Appearance: He is ill-appearing and diaphoretic.  HENT:     Head: Normocephalic and atraumatic.      Right Ear: External ear normal.     Left Ear: External ear normal.     Nose: Nose normal.     Mouth/Throat:     Mouth: Mucous membranes are dry.  Eyes:     Extraocular Movements: Extraocular movements intact.     Conjunctiva/sclera: Conjunctivae normal.     Pupils: Pupils are equal, round, and reactive to light.  Cardiovascular:     Rate and Rhythm: Bradycardia present.     Pulses: Normal pulses.     Heart sounds: Normal heart sounds.  Pulmonary:     Effort: Pulmonary effort is normal.     Breath sounds: Normal breath sounds.  Abdominal:     General: Abdomen is flat. Bowel sounds are normal.     Palpations: Abdomen is soft.  Musculoskeletal:        General: Normal range of motion.     Cervical back: Normal range of motion and neck supple.  Skin:    General: Skin is warm.     Capillary Refill: Capillary refill takes less than 2 seconds.  Neurological:     General: No focal deficit present.     Mental Status: He is  alert and oriented to person, place, and time.  Psychiatric:        Mood and Affect: Mood normal.        Behavior: Behavior normal.     ED Results / Procedures / Treatments   Labs (all labs ordered are listed, but only abnormal results are displayed) Labs Reviewed  CBC WITH DIFFERENTIAL/PLATELET - Abnormal; Notable for the following components:      Result Value   RBC 4.12 (*)    HCT 38.2 (*)    All other components within normal limits  CBG MONITORING, ED - Abnormal; Notable for the following components:   Glucose-Capillary 325 (*)    All other components within normal limits  COMPREHENSIVE METABOLIC PANEL  URINALYSIS, ROUTINE W REFLEX MICROSCOPIC  I-STAT CHEM 8, ED  TROPONIN I (HIGH SENSITIVITY)    EKG EKG Interpretation  Date/Time:  Monday April 17 2022 07:20:22 EDT Ventricular Rate:  38 PR Interval:  86 QRS Duration: 124 QT Interval:  470 QTC Calculation: 374 R Axis:   53 Text Interpretation: compete hb vs high 2nd degree hb Confirmed by  Jacalyn Lefevre [phone]) on 05/05/2022 8:08:49 AM  Radiology DG Chest Port 1 View  Result Date: 04/12/2022 CLINICAL DATA:  Pt to ED from work c/o witnessed syncopal episode. Diaphoretic on arrival, cbg 286. Hypotensive at 76/57. Bradycardia. EXAM: PORTABLE CHEST 1 VIEW COMPARISON:  None Available. FINDINGS: Cardiac silhouette is normal in size. No mediastinal or hilar masses. Clear lungs. No pleural effusion or gross pneumothorax on this supine exam. Skeletal structures are grossly intact IMPRESSION: No active disease. Electronically Signed   By: Amie Portland M.D.   On: 05/03/2022 07:58    Procedures Procedures    Medications Ordered in ED Medications  0.9 %  sodium chloride infusion (has no administration in time range)  norepinephrine (LEVOPHED) 4mg  in (0.016 mg/mL) premix infusion (10 mcg/min Intravenous Rate/Dose Change 05/05/2022 0806)  norepinephrine (LEVOPHED) 4-5 MG/250ML-% infusion SOLN (has no administration in time range)  DOPamine (INTROPIN) 800 mg in dextrose 5 % 250 mL (3.2 mg/mL) infusion (has no administration in time range)  sodium chloride 0.9 % bolus 1,000 mL (0 mLs Intravenous Stopped 04/27/2022 0801)  atropine 1 MG/10ML injection 1 mg (1 mg Intravenous Given 04/29/2022 0726)  sodium chloride 0.9 % bolus 1,000 mL (0 mLs Intravenous Stopped 04/20/2022 0808)  sodium chloride 0.9 % bolus 1,000 mL (1,000 mLs Intravenous New Bag/Given 04/08/2022 0808)    ED Course/ Medical Decision Making/ A&P                           Medical Decision Making Amount and/or Complexity of Data Reviewed Labs: ordered. Radiology: ordered. ECG/medicine tests: ordered.  Risk Prescription drug management.   This patient presents to the ED for concern of syncope, this involves an extensive number of treatment options, and is a complaint that carries with it a high risk of complications and morbidity.  The differential diagnosis includes cardiac, pulm, heat, electrolyte   Co morbidities that  complicate the patient evaluation  Htn, high cholesterol, dm   Additional history obtained:  Additional history obtained from epic chart review External records from outside source obtained and reviewed including EMS report   Lab Tests:  I Ordered, and personally interpreted labs.  The pertinent results include:  cbc nl   Imaging Studies ordered:  I ordered imaging studies including cxr  I independently visualized and interpreted imaging which showed  IMPRESSION:  No active disease.   I agree with the radiologist interpretation   Cardiac Monitoring:  The patient was maintained on a cardiac monitor.  I personally viewed and interpreted the cardiac monitored which showed an underlying rhythm of: bradycardia    Medicines ordered and prescription drug management:  I ordered medication including atropone and ivfs  for bradycardia and hypotension  Reevaluation of the patient after these medicines showed that the patient stayed the same I have reviewed the patients home medicines and have made adjustments as needed   Test Considered:  cath   Critical Interventions:  cath   Consultations Obtained:  I requested consultation with the cardiologist (Dr. Tenny Craw),  and discussed lab and imaging findings as well as pertinent plan - she recommended [facility] to [facility]   Problem List / ED Course:  Symptomatic bradycardia.  Pt has either a complete hb or a high 2nd degree hb with associated hypotension.  Pt d/w Dr. Tenny Craw who arranged for [facility] to come get pt and take to the cath lab.  Pt given atropine which did not help.  Pt given IVFs.  Pacer patches placed on pt.  I wanted to try to avoid TC pacing if possible as that is painful and bp too low for sedation.  So, he was started on levophed.  This has helped a little, but not a lot.  So, dopamine was started which has helped more.  [facility] is here now for transport.  Pt is as stable as possible.  Dr. Deretha Emory will be the  accepting ED doc if he has to go to the ED.  Hopefully, pt will go to the cath lab directly; however.   Reevaluation:  After the interventions noted above, I reevaluated the patient and found that they have :improved   Social Determinants of Health:  Lives at home   Dispostion:  After consideration of the diagnostic results and the patients response to treatment, I feel that the patent would benefit from transfer to [facility].    CRITICAL CARE Performed by: Jacalyn Lefevre   Total critical care time: 45 minutes  Critical care time was exclusive of separately billable procedures and treating other patients.  Critical care was necessary to treat or prevent imminent or life-threatening deterioration.  Critical care was time spent personally by me on the following activities: development of treatment plan with patient and/or surrogate as well as nursing, discussions with consultants, evaluation of patient's response to treatment, examination of patient, obtaining history from patient or surrogate, ordering and performing treatments and interventions, ordering and review of laboratory studies, ordering and review of radiographic studies, pulse oximetry and re-evaluation of patient's condition.         Final Clinical Impression(s) / ED Diagnoses Final diagnoses:  Symptomatic bradycardia    Rx / DC Orders ED Discharge Orders     None         Jacalyn Lefevre, MD 11-May-2022 [phone]

## 2022-04-17 NOTE — Consult Note (Addendum)
Advanced Heart Failure Team Consult Note   Primary Physician: Eustace Pen, PA-C (Inactive) PCP-Cardiologist:  None  Reason for Consultation: Cardiogenic shock  HPI:    James White is seen today for evaluation of cardiogenic shock at the request of Dr. Ladona Ridgel with EP. 61 y.o. male with history of DM, HTN and HLD. History obtained on chart review. He presented to [facility] this am after a syncopal episode at work this am. Had not been feeling well for a couple of days and thought it was d/t the heat. EMS was called. He was in 2:1 AV block with rates in 30s-40s. He was hypotensive with BP 76/57. He was given 3L IV fluids. He was transferred to [facility] for consideration of PPM.   En route to [facility] he developed volume overload with cough and respiratory distress. He required intubation for airway protection. While in ED he was intermittently in AF with RVR, rates up to 120s. Had periods of NSR with intermittent HB, rate as low as 30s. He was started on dopamine infusion. There was STT change in leads V1 and V2 on ECG concerning for posterior MI.  He developed cardiogenic shock. The patient was externally paced and taken emergently to the cath lab with plans for temporary pacing and L/RHC. He was found to have 80% pRCA, 100% ostial Lcx, 80% mLM to p LAD, 95% proximal LAD. Culprit thought to be occluded Lcx, unable to locate origin. D/t persistent shock Impella CP was placed for mechanical support and he underwent 2 overlapping DES m LM into p LAD. He suffered PEA arrest post intervention. D/t profound shock with acidemia and inability to ventilate he was cannulated for VA ECMO.     Review of Systems: Unable to assess, intubated and sedated  Home Medications Prior to Admission medications   Medication Sig Start Date End Date Taking? Authorizing Provider  acyclovir (ZOVIRAX) 400 MG tablet Take 2 tablets (800 mg total) by mouth 5 (five) times daily. 05/04/13   Geoffery Lyons, MD   HYDROcodone-acetaminophen (NORCO) 5-325 MG per tablet Take 2 tablets by mouth every 4 (four) hours as needed for pain. 05/04/13   Geoffery Lyons, MD  Olmesartan-Amlodipine-HCTZ (TRIBENZOR PO) Take by mouth.    [provider]  predniSONE (DELTASONE) 10 MG tablet Take 2 tablets (20 mg total) by mouth 2 (two) times daily. 05/04/13   Geoffery Lyons, MD    Past Medical History: Past Medical History:  Diagnosis Date   High cholesterol    Hypertension     Past Surgical History: Past Surgical History:  Procedure Laterality Date   KNEE SURGERY      Family History: History reviewed. No pertinent family history.  Social History: Social History   Socioeconomic History   Marital status: Single    Spouse name: Not on file   Number of children: Not on file   Years of education: Not on file   Highest education level: Not on file  Occupational History   Not on file  Tobacco Use   Smoking status: Never   Smokeless tobacco: Not on file  Substance and Sexual Activity   Alcohol use: No   Drug use: No   Sexual activity: Not on file  Other Topics Concern   Not on file  Social History Narrative   Not on file   Social Determinants of Health   Financial Resource Strain: Not on file  Food Insecurity: Not on file  Transportation Needs: Not on file  Physical Activity:  Not on file  Stress: Not on file  Social Connections: Not on file    Allergies:  No Known Allergies  Objective:    Vital Signs:   Temp:  [97.2 F (36.2 C)] 97.2 F (36.2 C) (07/10 0805) Pulse Rate:  [33-123] 70 (07/10 1000) Resp:  [10-38] 26 (07/10 1000) BP: (68-110)/(46-86) 68/52 (07/10 1000) SpO2:  [56 %-100 %] 79 % (07/10 1123) FiO2 (%):  [100 %] 100 % (07/10 0957) Weight:  [109.3 kg] 109.3 kg (07/10 0724)    Weight change: Filed Weights   04/14/2022 0724  Weight: 109.3 kg    Intake/Output:  No intake or output data in the 24 hours ending 04/10/2022 1225    Physical Exam    General:   Critically ill appeared. Sedated on vent HEENT: + ETT Neck: supple. JVP elevated . Carotids 2+ bilat; no bruits.  Cor: PMI nondisplaced. Regular rate & rhythm. No rubs, gallops or murmurs. Lungs: coarse Abdomen: obese, soft, nontender, nondistended.  Extremities: no cyanosis, clubbing, rash, edema Neuro: intubated and sedated   Labs   Basic Metabolic Panel: Recent Labs  Lab 04/23/2022 0730 04/12/2022 0934 04/23/2022 1020  NA 135  --  133*  K 3.6  --  4.7  CL 101  --  103  CO2 20*  --   --   GLUCOSE 356*  --  504*  BUN 20  --  23  CREATININE 1.62*  --  2.10*  CALCIUM 8.7*  --   --   MG  --  1.7  --     Liver Function Tests: Recent Labs  Lab 05/01/2022 0730  AST 36  ALT 36  ALKPHOS 77  BILITOT 0.3  PROT 6.7  ALBUMIN 3.6   No results for input(s): "LIPASE", "AMYLASE" in the last 168 hours. No results for input(s): "AMMONIA" in the last 168 hours.  CBC: Recent Labs  Lab 04/23/2022 0730 04/08/2022 1020  WBC 10.2  --   NEUTROABS 5.4  --   HGB 13.0 15.0  HCT 38.2* 44.0  MCV 92.7  --   PLT 276  --     Cardiac Enzymes: No results for input(s): "CKTOTAL", "CKMB", "CKMBINDEX", "TROPONINI" in the last 168 hours.  BNP: BNP (last 3 results) No results for input(s): "BNP" in the last 8760 hours.  ProBNP (last 3 results) No results for input(s): "PROBNP" in the last 8760 hours.   CBG: Recent Labs  Lab 05/04/2022 0717  GLUCAP 325*    Coagulation Studies: Recent Labs    04/13/2022 0934  LABPROT 12.6  INR 1.0     Imaging   DG Chest Port 1 View  Result Date: 04/12/2022 CLINICAL DATA:  Pt to ED from work c/o witnessed syncopal episode. Diaphoretic on arrival, cbg 286. Hypotensive at 76/57. Bradycardia. EXAM: PORTABLE CHEST 1 VIEW COMPARISON:  None Available. FINDINGS: Cardiac silhouette is normal in size. No mediastinal or hilar masses. Clear lungs. No pleural effusion or gross pneumothorax on this supine exam. Skeletal structures are grossly intact IMPRESSION: No  active disease. Electronically Signed   By: Amie Portland M.D.   On: 05/01/2022 07:58     Medications:     Current Medications:  aspirin  81 mg Oral Pre-Cath   [MAR Hold] fentaNYL (SUBLIMAZE) injection  50 mcg Intravenous Once   [MAR Hold] insulin aspart  0-9 Units Subcutaneous Q4H   [MAR Hold] pantoprazole (PROTONIX) IV  40 mg Intravenous QHS   sodium bicarbonate  100 mEq Intravenous Once   [facility]  Hold] sodium chloride flush  3 mL Intravenous Q12H    Infusions:  sodium chloride 50 mL/hr at 05/08/2022 [id number]   sodium chloride     DOPamine 18 mcg/kg/min (04/09/2022 1027)   fentaNYL infusion INTRAVENOUS 50 mcg/hr (04/26/2022 0947)   midazolam 6 mg/hr (04/28/2022 1043)   norepinephrine Stopped (04/19/2022 0933)   norepinephrine 25 mcg/min (04/29/2022 1201)   norepinephrine (LEVOPHED) Adult infusion 50 mcg/min (05/03/2022 1220)   sodium bicarbonate 150 mEq in sterile water 1,150 mL infusion        Patient Profile   61 y.o. male with history of DM, HTN and HLD presenting after syncopal episode. Found to be in 2:1 AV block with rates 30s-40s. Subsequently developed respiratory failure and cardiogenic shock.  Assessment/Plan   Acute systolic CHF>>Cardiogenic shock: -2/2 Acute MI. Culprit occluded Lcx, could not find origin. S/p Impella CP and PCI/placement of 2 overlapping DES LM into d LAD. Also had 80% RCA -RHC: RA 35/33, PA 70/20, PCWP mean 48 (V wave 54), CO/CI 3.2/1.45 -Refractory shock and acidemia >> cannulated for VA ECMO -Currently 10 Epi, 40 NE, 9 Dopamine. DBA weaned off. -Lactic acid > 9 -Now on bicarb gtt d/t persistent acidemia -Awaiting echo -Heparin gtt  2. PEA arrest -In setting of profound shock and acidemia  3. Acute MI -Culprit likely occluded Lcx. Unable to find origin.  S/p PCI/placement of 2 overlapping DES distal LM into d LAD -Received cangrelor in lab -On DAPT with aspirin and brilinta. Loading dose of brilinta this afternoon then 90 BID. -Will need statin if LFTs  okay in setting of shock  4. Acute respiratory failure with hypoxia: -Vent management per PCCM  5. CHB:  -In setting of acute MI -Seen by EP. Felt to be mostly 2:1 AV block -S/p placement temporary pacer -May need PPM once stabilized  6. New atrial fibrillation with RVR: -Intermittent runs in ED  7. DM II: -Appears uncontrolled -Blood glucose > 500 -SSI -Check A1c  8. AKI: -In setting of shock -Baseline uncertain, Scr [id number]  -Expect Scr will continue to trend up over next couple of days. -Monitor closely    Length of Stay: 0  FINCH, LINDSAY N, PA-C  04/18/2022, 12:25 PM  Advanced Heart Failure Team Pager [phone] (M-F; 7a - 5p)  Please contact [facility] for night-coverage after hours (4p -7a ) and weekends on [email or web address]   Patient seen with PA, agree with the above note.    History as outlined above.  Patient felt poorly x 2 days, was noted to be in 2:1 AVB then CHB in ER.  There was concern for posterior MI.  He was externally paced.  He developed respiratory distress and was intubated.  Initial angiography showed occluded ostial LCx without collaterals (thought to be culprit) as well as 80% pRCA and 80% mid LM into ostial LAD and 95% proximal LAD.  RCA was relatively small. RHC showed markedly elevated right and left heart filling pressures and CI 1.45.  Impella was initially placed, initial Impella clotted and had to be removed.  Echo did not show an obvious LV thrombus using Definity. Temporary transvenous pacemaker was then placed and Impella was replaced successfully.  Patient initially started to improve but suffered PEA arrest involving about 2 minutes of CPR and epinephrine/HCO3 boluses.  Oxygen saturation remained poor.  It was elected at this point to proceed with VA ECMO, patient was cannulated from femoral access with distal protection catheters in both SFAs (Impella on right and ECMO arterial cannula  on left).   Patient was stable initially on [place] ECMO but  after moving to the CCU, he developed diffuse bleeding as his left arterial ECMO cannulation site with associated hematoma.  Pressure was held and VVS was consulted. A bolster was sutured around the left groin site with resolution of the bleeding for the time being. If bleeding recurs, our only option is going to be removal of the arterial cannula from the left CFA with vascular repair and reconfiguration to right femoral vein/right IJ VV ECMO with Impella support.    General: Intubated/sedated/paralyzed Neck: JVP elevated, no thyromegaly or thyroid nodule.  Lungs: Clear to auscultation bilaterally with normal respiratory effort. CV: Nondisplaced PMI.  Heart regular S1/S2, no S3/S4, no murmur.  No peripheral edema.  No carotid bruit.   Abdomen: Soft, nontender, no hepatosplenomegaly, mild distention.  Skin: Intact without lesions or rashes.  Neurologic: Sedated/paralyzed on vent.  Extremities: No clubbing or cyanosis.  HEENT: Normal.   1. Cardiogenic shock: Ischemic cardiomyopathy.  After PCI to LM/LAD, Impella, and VA ECMO, the LV EF appeared to be around 40% with mild RV systolic dysfunction.  Patient is currently on epinephrine 10, norepinephrine 15, and vasopressin 0.03.  Impella for LV vent at P1 with flow 0.8 L/min.  VA ECMO ongoing.  Marked volume overload based on RHC.   - Patient is starting to make some urine but this is unlikely to be adequate.  He will need CVVH for volume removal, will initiate later this evening.  Renal consulted.  - Wean pressors as able.  - Continue Impella P1 - Continue VA ECMO.  - Heparin gtt for ECMO and Impella.  2. CAD: Presented with posterior MI and occluded ostial LCx as likely culprit.  Unable to wire the LCx.  Up to 95% stenosis left main into LAD, DES LM to proximal LAD.   - Continue ticagrelor, off Cangrelor.  - statin.  - ASA 81.  3. VT arrest: In setting of bleeding from left groin.  DCCV x 1 with no CPR.  - Continue amiodarone gtt.  4. PEA  arrest: Post-PCI.  Required about 2 minutes CPR then resolved with epinephrine/HCO3.  5. Acute hypoxemic respiratory failure: Volume overload/pulmonary edema.  Now managed by VA ECMO.  CCM following.  - Paralyzed.  - Vent per CCM.  - Needs volume removed via CVVH, see above.  - CXR.  6. Arterial cannula site bleeding: Bolsters sutured in place.  VVS following.  7. Anemia: He has had 4 units PRBCs so far.  Transfuse to keep hgb > 8.  8. AKI on ?CKD 3: Creatinine 1.6 at admission, unsure of baseline.  Contrast and hypotension/shock.  - Will need CVVH as above.  9. Metabolic acidosis: Support cardiac output, on HCO3 gtt.  10. Type 2 diabetes: SSI 11. Complete heart block: Has temporary transvenous pacemaker now, likely related to posterior MI.  Currently not pacing.    Marca Ancona 04/20/2022

## 2022-04-17 NOTE — H&P (Addendum)
ELECTROPHYSIOLOGY CONSULT NOTE    Patient ID: James White MRN: [id number], DOB/AGE: [birth year] 61 y.o.  Admit date: 04/21/2022 Date of Consult: 04/08/2022  Primary Physician: Donnamae Jude, PA-C (Inactive) Primary Cardiologist: None  Electrophysiologist:  New  Referring Provider: Dr. Gilford Raid  Patient Profile: James White is a 61 y.o. male with a history of HTN and diabetes who is being seen today for the evaluation of advanced AV block at the request of Dr. Gilford Raid.  HPI:  James White is a 61 y.o. male presented to [facility] with syncope this am, found to be in 2:1 AV block in 30-40s.    Pt had been feeling poorly all weekend and stayed inside thinking it was the heat. Works at [employer]. Went to work this am and passed out. Diaphoretic and EMS was called. Initial BP 76/57. CBG 286. IV was started there and brought to [facility]. 2:1 AVB on admission and arrangements made to transfer to [facility] for PPM consideration.   Cough started on way to [facility] with 3L of fluid given at [place].  Intermittently HRs down as low as 29.   Glucose 296, Hgb 13.0, WBC 10.2, K 3.6, Cr 1.62, HS trop 99 -> pending.   On our exam pt intermittently with AF with RVR up to 120s, then breaking to NSR with intermittent HB as low as 30s.  Pt remains hypotensive with AMS requiring increasing doses of dopamine. Plan for urgent cath given new heart block and ST depression. Pt will also require temp pacing wire and likely aggressive diuresis. HF team made aware, to see pending procedures.   With continued respiratory distress, plan for intubation in ED prior to procedures.   Past Medical History:  Diagnosis Date   High cholesterol    Hypertension      Surgical History:  Past Surgical History:  Procedure Laterality Date   KNEE SURGERY       (Not in a hospital admission)   Inpatient Medications:   Allergies: No Known Allergies  Social History   Socioeconomic History   Marital status: Single     Spouse name: Not on file   Number of children: Not on file   Years of education: Not on file   Highest education level: Not on file  Occupational History   Not on file  Tobacco Use   Smoking status: Never   Smokeless tobacco: Not on file  Substance and Sexual Activity   Alcohol use: No   Drug use: No   Sexual activity: Not on file  Other Topics Concern   Not on file  Social History Narrative   Not on file   Social Determinants of Health   Financial Resource Strain: Not on file  Food Insecurity: Not on file  Transportation Needs: Not on file  Physical Activity: Not on file  Stress: Not on file  Social Connections: Not on file  Intimate Partner Violence: Not on file     History reviewed. No pertinent family history.   Review of Systems: All other systems reviewed and are otherwise negative except as noted above.  Physical Exam: Vitals:   04/18/2022 0812 04/08/2022 0815 04/30/2022 0818 05/03/2022 0820  BP: (!) 79/63 (!) 87/65 (!) 72/46 (!) 78/66  Pulse:      Resp: 17 (!) 22 10 17   Temp:      TempSrc:      SpO2:      Weight:      Height:  GEN- The patient is acutely ill appearing, alert and oriented x 3 today.   HEENT: normocephalic, atraumatic; sclera clear, conjunctiva pink; hearing intact; oropharynx clear; neck supple Lungs- scattered rales and rhonchi, increased work of breathing. Heart- Regular rate and rhythm, no murmurs, rubs or gallops GI- soft, non-tender, non-distended, bowel sounds present Extremities- no clubbing, cyanosis, or edema; DP/PT/radial pulses 2+ bilaterally MS- no significant deformity or atrophy Skin- warm and dry, no rash or lesion Psych- euthymic mood, full affect Neuro- strength and sensation are intact  Labs:   Lab Results  Component Value Date   WBC 10.2 04/08/2022   HGB 13.0 04/23/2022   HCT 38.2 (L) 04/13/2022   MCV 92.7 04/23/2022   PLT 276 04/13/2022    Recent Labs  Lab 04/16/2022 0730  NA 135  K 3.6  CL 101  CO2 20*   BUN 20  CREATININE 1.62*  CALCIUM 8.7*  PROT 6.7  BILITOT 0.3  ALKPHOS 77  ALT 36  AST 36  GLUCOSE 356*      Radiology/Studies: DG Chest Port 1 View  Result Date: 05/03/2022 CLINICAL DATA:  Pt to ED from work c/o witnessed syncopal episode. Diaphoretic on arrival, cbg 286. Hypotensive at 76/57. Bradycardia. EXAM: PORTABLE CHEST 1 VIEW COMPARISON:  None Available. FINDINGS: Cardiac silhouette is normal in size. No mediastinal or hilar masses. Clear lungs. No pleural effusion or gross pneumothorax on this supine exam. Skeletal structures are grossly intact IMPRESSION: No active disease. Electronically Signed   By: Amie Portland M.D.   On: 04/10/2022 07:58    EKG:on arrival shows CHB in 30s with ? Posterior depression. Not diagnosis given escape rhythm. (personally reviewed)  TELEMETRY: AF RVR up to 120, breaks to sinus in 50-70s with intermittent HB as low as 30s (personally reviewed)  Assessment/Plan: 1.  New advanced HB Given medical history of HTN, obesity, and DM concerning for new CAD.  Will need temp pacing and L/RHC.  Plan echo once stable.  PPM will be required but we will need to assess status of coronary arteries.   2. Acute hypoxic respiratory failure Plan intubation in ED then likely CCM to follow. His volume overload likely due to combination of fluids and heart block  3. New atrial fibrillation CHA2DS2VASc is at least 2.Echo pending. Will potentially need OAC, vs following burden on pacer (if ends up needing) Would avoid OAC pending procedures.   4. DM2 SSI. Hold metformin given procedures.   5. HTN Follow.   Dr. Ladona Ridgel has seen. Plan for urgent procedures and further disposition pending results and course.  Not candidate for permanent pacing today.    HF team aware of pt, will likely need aggressive diuresis pending procedures.   Echo pending.   For questions or updates, please contact [facility] Please consult [email or web address] for contact info under  Cardiology/STEMI.  Dustin Flock, PA-C  04/13/2022 9:08 AM   EP Attending  Patient seen and examined. Agree with above. Minimal modification. The patient presents acutely ill though has felt poorly the last 2 days. He denies angina but is diabetic. ECG does not show ST elevation though there are STT changes in V1 and V2 that are concerning for possible posterior infarction. His degree of hypotension is out of proportion to the heart block. His exam demonstrates an acutely ill man, in respiratory distress. Lungs reveal auditory rales and rhonchi and increased work of breathing. CV reveals an IRIR tachy. Ext are warm and he answers questions with difficulty due to  dyspnea. He is coughing uncontrollably which just started in transit to [facility].  A/P Resp failure - he will need mechanical ventilation. He will need diuresis. CHF service will be brought in to help with his care.  CHB - mostly 2:1 AV block. His symptoms are out of proportion to his heart block. He will likely need PPM once he has stabilized. HTN - he was hypotensive in the ED. Use ionotropic support as needed.  Sharlot Gowda Avett Reineck,MD

## 2022-04-17 NOTE — Progress Notes (Signed)
Pt transported on vent from 023 to cath lab 5 without any compilations. RN at bedside, vitals stable, RT will monitor.

## 2022-04-17 NOTE — ED Notes (Addendum)
Attempted to call sister -Angelique Blonder simpson x 2 , no answer but left message.

## 2022-04-17 NOTE — ED Notes (Signed)
[facility] at bedside 

## 2022-04-17 NOTE — ED Triage Notes (Signed)
Patient arrived by [facility] from [facility]. Patient arrived in complete heart block and altered. Patient received 3l of NS prior to arrival and now SOB and appears overloaded Arrived on dopamine at 106mcg/kg Ladona Ridgel MD cards at bedside Due to mental status and respiratory status preparing to intubate

## 2022-04-17 NOTE — ED Triage Notes (Signed)
Pt to ED from work c/o witnessed syncopal episode. Diaphoretic on arrival, cbg 286. Hypotensive at 76/57. 20g L hand

## 2022-04-17 NOTE — ED Notes (Signed)
Called [employer]

## 2022-04-17 NOTE — Inpatient Diabetes Management (Signed)
Inpatient Diabetes Program Recommendations  AACE/ADA: New Consensus Statement on Inpatient Glycemic Control (2015)  Target Ranges:  Prepandial:   less than 140 mg/dL      Peak postprandial:   less than 180 mg/dL (1-2 hours)      Critically ill patients:  140 - 180 mg/dL   Lab Results  Component Value Date   GLUCAP 325 (H) Apr 22, 2022    Review of Glycemic Control  Latest Reference Range & Units April 22, 2022 07:30  Glucose 70 - 99 mg/dL 347 (H)   Diabetes history: None Current orders for Inpatient glycemic control: None  Inpatient Diabetes Program Recommendations:    -  Consider Novolog 0-9 units Q4 hours  Thanks,  Christena Deem RN, MSN, BC-ADM Inpatient Diabetes Coordinator Team Pager [phone] (8a-5p)

## 2022-04-17 NOTE — ED Notes (Signed)
Oxygen sats 89% on NRBM. RT and MD remain at bedside as well as pharmacy

## 2022-04-17 NOTE — Progress Notes (Signed)
  ECMO INITIATION   Patient: James White, 10/27/1960, 61 y.o. Location: cath lab 5  Date of Service:  May 02, 2022     Time: 1930  Date of Admission: 02-May-2022 Admitting diagnosis: bradycardia, myocardial infarction  Ht: 5\' 8"  (172.7 cm) Wt: 109.3 kg BSA: Body surface area is 2.29 meters squared.  Blood Type: O POS Allergies: No Known Allergies  Past medical history:  Past Medical History:  Diagnosis Date   High cholesterol    Hypertension    Past surgical history:  Past Surgical History:  Procedure Laterality Date   KNEE SURGERY      Indication for ECMO: Cardiogenic Shock  ECMO was deployed at 1215 and initiated at 61. Anticoagulation achieved with Heparin bolus of 3000 units given to patient at 1424 and another 2000 units at 1510. Cannulated for ECMO Mode: VA and achieved initial ECMO Flow (LPM): 3.69 and ECMO Sweep Gas (LPM): 2.    ECMO Cannula Information     Staff Present  Primary Perfusionist [facility]  Assisting Perfusionist/ECMO Specialist [facility], RN Angela Burke, RN Gar Gibbon, RRT  Cannulating Physician Dewain Penning MD   ECMO Lot Numbers  CardioHelp Console  [facility]  Oxygenator  [facility]  Tubing Pack    ECMO Goals  Flow goal   Flow Goal: 3.5-4.0  Anticoagulation goal   Anticoagulation Goal: 60-80ptt  Cardiac goal    MAP > 65  Respiratory goal    Sats > 93%  Other goal   Other Goal: Hgb >8.0   ECMO Handoff  Patient Information * Age Height Weight BSA IBW BMI  61 y.o. 5\' 8"  (172.7 cm)  (109.3 kg Body surface area is 2.29 meters squared. No data recorded Body mass index is 36.64 kg/m.   Review History * Primary Diagnosis   Bradycardia, myocardial infarction  Prior Cardiac Arrest within 24hrs of ECMO initiation? yes  ECMO and MCS * Type ECMO Flow ECMO Sweep Gases   ECMO Device: Cardiohelp   Flow (LPM): 3.69   Sweep Gas (LPM): 2     Additional Mechanical Support Impella CP  Ventilation *    $ Ventilator Initial/Subsequent :  Initial, Vent Mode: PCV (Pediatric mode due to ECMO settings), Vt Set: (S) 650 mL, Set Rate: (S) 10 bmp, FiO2 (%): 100 %, I Time: 1.01 Sec(s), PEEP: (S) 10 cmH20     Cannula Size and Locations       Drainage 23-25 Fr right femoral   Return 19 Fr left femoral    *Cannula(e) sutured and anchored, secured and dressed.   Labs and Imaging *  *Cannulation position verified via imaging on arrival to ICU. Concerns communicated to attending surgeon. Labs reviewed.   All ECMO safety checks complete. ECMO flowsheet initiated, applicable charges captured, LDA's entered/confirmed, imaging and labs verified, and blood products available. Patient transferred to 2H03 without issue. Transition of care with team from cath lab at the bedside.

## 2022-04-17 NOTE — Consult Note (Signed)
NAME:  James White, MRN:  [id number], DOB:  October 07, 1961, LOS: 0 ADMISSION DATE:  04/21/2022, CONSULTATION DATE:  04/14/2022 REFERRING MD:  Particia Nearing, CHIEF COMPLAINT:  syncope, HG   History of Present Illness:  James White is a 61 y.o. M with PMH of HTN and HL who presented to the ED with a syncopal episode while at work.  History taken from epic and patient was intubated.  He noted feeling poorly for several days prior and thought this was secondary to the head.  He went to work this morning and became diaphoretic and passed out.  On EMS arrival he was hypotensive and bradycardic as low as 29, EKG concerning for complete heart bloack.  He was intubated in the ED. Labs significant for trop of 99, creatinine 1.6, K 3.6, glucose >500 initially.  He was externally paced, then in intermittent atrial fib.  and seen by EP, plan for ICU admission and temp pacer with L and R heart cath.  PCCM consulted for ventilator management  Pertinent  Medical History   has a past medical history of High cholesterol and Hypertension.   Significant Hospital Events: Including procedures, antibiotic start and stop dates in addition to other pertinent events   7/10 Syncopal episode, bradycardia and complete HB, intubated, plan for temp pacer and cath lab, PCCM consult  Interim History / Subjective:   Intubated and sedated, on Dopamine   Objective   Blood pressure 108/85, pulse (!) 123, temperature (!) 97.2 F (36.2 C), temperature source Oral, resp. rate 18, height 5\' 8"  (1.727 m), weight 109.3 kg, SpO2 100 %.    Vent Mode: PRVC FiO2 (%):  [100 %] 100 % Set Rate:  [18 bmp] 18 bmp Vt Set:  [580 mL] 580 mL PEEP:  [10 cmH20] 10 cmH20  No intake or output data in the 24 hours ending 05/03/2022 1014 Filed Weights   04/21/2022 0724  Weight: 109.3 kg    General:  critically ill-appearing M, intubated and sedated  HEENT: MM pink/moist, sclera anicteric, ETT in place, pupils equal Neuro: examined on Fentanyl, RASS -4,   CV: s1s2 rrr, no m/r/g PULM:  mechanical breath sounds bilaterally, equal chest rise, synchronous with vent GI: soft, obese, soft Extremities: warm/dry, no edema  Skin: no rashes or lesions   Resolved Hospital Problem list    Assessment & Plan:    Acute Hypoxic respiratory failure in the setting of syncopal episode and new onset atrial fibrillation Concerning for CAD -admit to ICU -Maintain full vent support with SAT/SBT as tolerated -titrate Vent setting to maintain SpO2 greater than or equal to 90%. -HOB elevated 30 degrees. -Plateau pressures less than 30 cm H20.  -Follow chest x-ray, ABG prn.   -Bronchial hygiene and RT/bronchodilator protocol. -PAD protocol with Fentanyl and Versed -management for HR per EP, continue Dopamine, diuresis, Asa -echo pending -trend trop -plan for temp pacer and L/R heart cath today    Type 2 DM -POC glucose checks -SSI and Hgb A1c    Kidney Injury Unclear chronicity as no prior labs in Epic Creatinine 1.6 -monitor renal indices, electrolytes and UOP and avoid nephrotoxins as able   HTN -holding home anti-hypertensive medications     Best Practice (right click and "Reselect all SmartList Selections" daily)   Diet/type: NPO DVT prophylaxis: SCD GI prophylaxis: PPI Lines: N/A Foley:  Yes, and it is still needed Code Status:  full code Last date of multidisciplinary goals of care discussion [per primary]  Labs   CBC:  Recent Labs  Lab 04/16/2022 0730  WBC 10.2  NEUTROABS 5.4  HGB 13.0  HCT 38.2*  MCV 92.7  PLT 276    Basic Metabolic Panel: Recent Labs  Lab 04/14/2022 0730  NA 135  K 3.6  CL 101  CO2 20*  GLUCOSE 356*  BUN 20  CREATININE 1.62*  CALCIUM 8.7*   GFR: Estimated Creatinine Clearance: 57.4 mL/min (A) (by C-G formula based on SCr of 1.62 mg/dL (H)). Recent Labs  Lab 04/08/2022 0730  WBC 10.2    Liver Function Tests: Recent Labs  Lab 05/03/2022 0730  AST 36  ALT 36  ALKPHOS 77  BILITOT  0.3  PROT 6.7  ALBUMIN 3.6   No results for input(s): "LIPASE", "AMYLASE" in the last 168 hours. No results for input(s): "AMMONIA" in the last 168 hours.  ABG No results found for: "PHART", "PCO2ART", "PO2ART", "HCO3", "TCO2", "ACIDBASEDEF", "O2SAT"   Coagulation Profile: No results for input(s): "INR", "PROTIME" in the last 168 hours.  Cardiac Enzymes: No results for input(s): "CKTOTAL", "CKMB", "CKMBINDEX", "TROPONINI" in the last 168 hours.  HbA1C: No results found for: "HGBA1C"  CBG: Recent Labs  Lab 04/11/2022 0717  GLUCAP 325*    Review of Systems:   Unable to obtain  Past Medical History:  He,  has a past medical history of High cholesterol and Hypertension.   Surgical History:   Past Surgical History:  Procedure Laterality Date   KNEE SURGERY       Social History:   reports that he has never smoked. He does not have any smokeless tobacco history on file. He reports that he does not drink alcohol and does not use drugs.   Family History:  His family history is not on file.   Allergies No Known Allergies   Home Medications  Prior to Admission medications   Medication Sig Start Date End Date Taking? Authorizing Provider  acyclovir (ZOVIRAX) 400 MG tablet Take 2 tablets (800 mg total) by mouth 5 (five) times daily. 05/04/13   Geoffery Lyons, MD  HYDROcodone-acetaminophen (NORCO) 5-325 MG per tablet Take 2 tablets by mouth every 4 (four) hours as needed for pain. 05/04/13   Geoffery Lyons, MD  Olmesartan-Amlodipine-HCTZ (TRIBENZOR PO) Take by mouth.    [provider]  predniSONE (DELTASONE) 10 MG tablet Take 2 tablets (20 mg total) by mouth 2 (two) times daily. 05/04/13   Geoffery Lyons, MD     Critical care time:  35 minutes     CRITICAL CARE Performed by: Darcella Gasman Cleota Pellerito   Total critical care time: 35 minutes  Critical care time was exclusive of separately billable procedures and treating other patients.  Critical care was necessary to  treat or prevent imminent or life-threatening deterioration.  Critical care was time spent personally by me on the following activities: development of treatment plan with patient and/or surrogate as well as nursing, discussions with consultants, evaluation of patient's response to treatment, examination of patient, obtaining history from patient or surrogate, ordering and performing treatments and interventions, ordering and review of laboratory studies, ordering and review of radiographic studies, pulse oximetry and re-evaluation of patient's condition.   Darcella Gasman Dacey Milberger, PA-C [facility] See Amion for pager If no response to pager , please call [phone] until 7pm After 7:00 pm call Elink  [phone]

## 2022-04-17 NOTE — ED Notes (Addendum)
PT now paced at 56mA and 70 bpm. Pt HR was bradying into 30-40's. Pt attempting to pull at lines and tubes. Medications adjusted, soft restraints applied. MD Zakowski notified.

## 2022-04-17 NOTE — Progress Notes (Signed)
RT assisted with transport of this pt from Cath lab to 2H04 while on full ventilatory support.

## 2022-04-18 ENCOUNTER — Inpatient Hospital Stay (HOSPITAL_COMMUNITY): Payer: Medicaid Other

## 2022-04-18 ENCOUNTER — Encounter (HOSPITAL_COMMUNITY): Payer: Self-pay | Admitting: Cardiovascular Disease

## 2022-04-18 DIAGNOSIS — T82838A Hemorrhage of vascular prosthetic devices, implants and grafts, initial encounter: Secondary | ICD-10-CM

## 2022-04-18 DIAGNOSIS — J9621 Acute and chronic respiratory failure with hypoxia: Secondary | ICD-10-CM

## 2022-04-18 DIAGNOSIS — I5021 Acute systolic (congestive) heart failure: Secondary | ICD-10-CM

## 2022-04-18 DIAGNOSIS — N170 Acute kidney failure with tubular necrosis: Secondary | ICD-10-CM

## 2022-04-18 DIAGNOSIS — I213 ST elevation (STEMI) myocardial infarction of unspecified site: Secondary | ICD-10-CM

## 2022-04-18 LAB — POCT I-STAT 7, (LYTES, BLD GAS, ICA,H+H)
Acid-Base Excess: 0 mmol/L (ref 0.0–2.0)
Acid-Base Excess: 2 mmol/L (ref 0.0–2.0)
Acid-Base Excess: 3 mmol/L — ABNORMAL HIGH (ref 0.0–2.0)
Acid-Base Excess: 6 mmol/L — ABNORMAL HIGH (ref 0.0–2.0)
Acid-Base Excess: 5 mmol/L — ABNORMAL HIGH (ref 0.0–2.0)
Acid-Base Excess: 4 mmol/L — ABNORMAL HIGH (ref 0.0–2.0)
Acid-Base Excess: 2 mmol/L (ref 0.0–2.0)
Acid-Base Excess: 3 mmol/L — ABNORMAL HIGH (ref 0.0–2.0)
Acid-base deficit: 4 mmol/L — ABNORMAL HIGH (ref 0.0–2.0)
Acid-base deficit: 2 mmol/L (ref 0.0–2.0)
Acid-base deficit: 3 mmol/L — ABNORMAL HIGH (ref 0.0–2.0)
Acid-base deficit: 1 mmol/L (ref 0.0–2.0)
Bicarbonate: 22.6 mmol/L (ref 20.0–28.0)
Bicarbonate: 23.6 mmol/L (ref 20.0–28.0)
Bicarbonate: 20.9 mmol/L (ref 20.0–28.0)
Bicarbonate: 21.6 mmol/L (ref 20.0–28.0)
Bicarbonate: 23.5 mmol/L (ref 20.0–28.0)
Bicarbonate: 25 mmol/L (ref 20.0–28.0)
Bicarbonate: 26.2 mmol/L (ref 20.0–28.0)
Bicarbonate: 29.8 mmol/L — ABNORMAL HIGH (ref 20.0–28.0)
Bicarbonate: 29.9 mmol/L — ABNORMAL HIGH (ref 20.0–28.0)
Bicarbonate: 29.4 mmol/L — ABNORMAL HIGH (ref 20.0–28.0)
Bicarbonate: 27.9 mmol/L (ref 20.0–28.0)
Bicarbonate: 26.6 mmol/L (ref 20.0–28.0)
Calcium, Ion: 1.07 mmol/L — ABNORMAL LOW (ref 1.15–1.40)
Calcium, Ion: 1.06 mmol/L — ABNORMAL LOW (ref 1.15–1.40)
Calcium, Ion: 1.06 mmol/L — ABNORMAL LOW (ref 1.15–1.40)
Calcium, Ion: 1.03 mmol/L — ABNORMAL LOW (ref 1.15–1.40)
Calcium, Ion: 1.04 mmol/L — ABNORMAL LOW (ref 1.15–1.40)
Calcium, Ion: 1.01 mmol/L — ABNORMAL LOW (ref 1.15–1.40)
Calcium, Ion: 1.01 mmol/L — ABNORMAL LOW (ref 1.15–1.40)
Calcium, Ion: 1 mmol/L — ABNORMAL LOW (ref 1.15–1.40)
Calcium, Ion: 1 mmol/L — ABNORMAL LOW (ref 1.15–1.40)
Calcium, Ion: 1 mmol/L — ABNORMAL LOW (ref 1.15–1.40)
Calcium, Ion: 0.99 mmol/L — ABNORMAL LOW (ref 1.15–1.40)
Calcium, Ion: 0.95 mmol/L — ABNORMAL LOW (ref 1.15–1.40)
HCT: 33 % — ABNORMAL LOW (ref 39.0–52.0)
HCT: 30 % — ABNORMAL LOW (ref 39.0–52.0)
HCT: 26 % — ABNORMAL LOW (ref 39.0–52.0)
HCT: 25 % — ABNORMAL LOW (ref 39.0–52.0)
HCT: 24 % — ABNORMAL LOW (ref 39.0–52.0)
HCT: 26 % — ABNORMAL LOW (ref 39.0–52.0)
HCT: 26 % — ABNORMAL LOW (ref 39.0–52.0)
HCT: 26 % — ABNORMAL LOW (ref 39.0–52.0)
HCT: 27 % — ABNORMAL LOW (ref 39.0–52.0)
HCT: 25 % — ABNORMAL LOW (ref 39.0–52.0)
HCT: 23 % — ABNORMAL LOW (ref 39.0–52.0)
HCT: 25 % — ABNORMAL LOW (ref 39.0–52.0)
Hemoglobin: 11.2 g/dL — ABNORMAL LOW (ref 13.0–17.0)
Hemoglobin: 10.2 g/dL — ABNORMAL LOW (ref 13.0–17.0)
Hemoglobin: 8.8 g/dL — ABNORMAL LOW (ref 13.0–17.0)
Hemoglobin: 8.5 g/dL — ABNORMAL LOW (ref 13.0–17.0)
Hemoglobin: 8.2 g/dL — ABNORMAL LOW (ref 13.0–17.0)
Hemoglobin: 8.8 g/dL — ABNORMAL LOW (ref 13.0–17.0)
Hemoglobin: 8.8 g/dL — ABNORMAL LOW (ref 13.0–17.0)
Hemoglobin: 8.8 g/dL — ABNORMAL LOW (ref 13.0–17.0)
Hemoglobin: 9.2 g/dL — ABNORMAL LOW (ref 13.0–17.0)
Hemoglobin: 8.5 g/dL — ABNORMAL LOW (ref 13.0–17.0)
Hemoglobin: 7.8 g/dL — ABNORMAL LOW (ref 13.0–17.0)
Hemoglobin: 8.5 g/dL — ABNORMAL LOW (ref 13.0–17.0)
O2 Saturation: 100 %
O2 Saturation: 100 %
O2 Saturation: 99 %
O2 Saturation: 99 %
O2 Saturation: 99 %
O2 Saturation: 100 %
O2 Saturation: 100 %
O2 Saturation: 99 %
O2 Saturation: 98 %
O2 Saturation: 91 %
O2 Saturation: 91 %
O2 Saturation: 98 %
Patient temperature: 37
Patient temperature: 36.1
Patient temperature: 36.3
Patient temperature: 36.1
Patient temperature: 36
Patient temperature: 36.1
Patient temperature: 36.1
Patient temperature: 36.1
Patient temperature: 36.1
Patient temperature: 36.5
Patient temperature: 36.4
Patient temperature: 36.4
Potassium: 3 mmol/L — ABNORMAL LOW (ref 3.5–5.1)
Potassium: 3.1 mmol/L — ABNORMAL LOW (ref 3.5–5.1)
Potassium: 3.2 mmol/L — ABNORMAL LOW (ref 3.5–5.1)
Potassium: 3.1 mmol/L — ABNORMAL LOW (ref 3.5–5.1)
Potassium: 3.1 mmol/L — ABNORMAL LOW (ref 3.5–5.1)
Potassium: 3 mmol/L — ABNORMAL LOW (ref 3.5–5.1)
Potassium: 3 mmol/L — ABNORMAL LOW (ref 3.5–5.1)
Potassium: 3.1 mmol/L — ABNORMAL LOW (ref 3.5–5.1)
Potassium: 3.2 mmol/L — ABNORMAL LOW (ref 3.5–5.1)
Potassium: 3.6 mmol/L (ref 3.5–5.1)
Potassium: 4 mmol/L (ref 3.5–5.1)
Potassium: 4.5 mmol/L (ref 3.5–5.1)
Sodium: 143 mmol/L (ref 135–145)
Sodium: 142 mmol/L (ref 135–145)
Sodium: 142 mmol/L (ref 135–145)
Sodium: 142 mmol/L (ref 135–145)
Sodium: 142 mmol/L (ref 135–145)
Sodium: 143 mmol/L (ref 135–145)
Sodium: 142 mmol/L (ref 135–145)
Sodium: 141 mmol/L (ref 135–145)
Sodium: 141 mmol/L (ref 135–145)
Sodium: 140 mmol/L (ref 135–145)
Sodium: 140 mmol/L (ref 135–145)
Sodium: 139 mmol/L (ref 135–145)
TCO2: 24 mmol/L (ref 22–32)
TCO2: 25 mmol/L (ref 22–32)
TCO2: 22 mmol/L (ref 22–32)
TCO2: 22 mmol/L (ref 22–32)
TCO2: 24 mmol/L (ref 22–32)
TCO2: 26 mmol/L (ref 22–32)
TCO2: 27 mmol/L (ref 22–32)
TCO2: 31 mmol/L (ref 22–32)
TCO2: 31 mmol/L (ref 22–32)
TCO2: 31 mmol/L (ref 22–32)
TCO2: 29 mmol/L (ref 22–32)
TCO2: 28 mmol/L (ref 22–32)
pCO2 arterial: 48.8 mmHg — ABNORMAL HIGH (ref 32–48)
pCO2 arterial: 42.7 mmHg (ref 32–48)
pCO2 arterial: 30.9 mmHg — ABNORMAL LOW (ref 32–48)
pCO2 arterial: 27 mmHg — ABNORMAL LOW (ref 32–48)
pCO2 arterial: 30.6 mmHg — ABNORMAL LOW (ref 32–48)
pCO2 arterial: 32.2 mmHg (ref 32–48)
pCO2 arterial: 31.9 mmHg — ABNORMAL LOW (ref 32–48)
pCO2 arterial: 36.9 mmHg (ref 32–48)
pCO2 arterial: 42.3 mmHg (ref 32–48)
pCO2 arterial: 47.6 mmHg (ref 32–48)
pCO2 arterial: 46.9 mmHg (ref 32–48)
pCO2 arterial: 33.1 mmHg (ref 32–48)
pH, Arterial: 7.274 — ABNORMAL LOW (ref 7.35–7.45)
pH, Arterial: 7.347 — ABNORMAL LOW (ref 7.35–7.45)
pH, Arterial: 7.436 (ref 7.35–7.45)
pH, Arterial: 7.508 — ABNORMAL HIGH (ref 7.35–7.45)
pH, Arterial: 7.49 — ABNORMAL HIGH (ref 7.35–7.45)
pH, Arterial: 7.494 — ABNORMAL HIGH (ref 7.35–7.45)
pH, Arterial: 7.52 — ABNORMAL HIGH (ref 7.35–7.45)
pH, Arterial: 7.513 — ABNORMAL HIGH (ref 7.35–7.45)
pH, Arterial: 7.454 — ABNORMAL HIGH (ref 7.35–7.45)
pH, Arterial: 7.396 (ref 7.35–7.45)
pH, Arterial: 7.379 (ref 7.35–7.45)
pH, Arterial: 7.511 — ABNORMAL HIGH (ref 7.35–7.45)
pO2, Arterial: 456 mmHg — ABNORMAL HIGH (ref 83–108)
pO2, Arterial: 458 mmHg — ABNORMAL HIGH (ref 83–108)
pO2, Arterial: 148 mmHg — ABNORMAL HIGH (ref 83–108)
pO2, Arterial: 117 mmHg — ABNORMAL HIGH (ref 83–108)
pO2, Arterial: 105 mmHg (ref 83–108)
pO2, Arterial: 176 mmHg — ABNORMAL HIGH (ref 83–108)
pO2, Arterial: 181 mmHg — ABNORMAL HIGH (ref 83–108)
pO2, Arterial: 131 mmHg — ABNORMAL HIGH (ref 83–108)
pO2, Arterial: 101 mmHg (ref 83–108)
pO2, Arterial: 60 mmHg — ABNORMAL LOW (ref 83–108)
pO2, Arterial: 60 mmHg — ABNORMAL LOW (ref 83–108)
pO2, Arterial: 98 mmHg (ref 83–108)

## 2022-04-18 LAB — LIPID PANEL
Cholesterol: 149 mg/dL (ref 0–200)
HDL: 25 mg/dL — ABNORMAL LOW (ref >40)
LDL Cholesterol: 86 mg/dL (ref 0–99)
Total CHOL/HDL Ratio: 6 RATIO
Triglycerides: 191 mg/dL — ABNORMAL HIGH (ref <150)
VLDL: 38 mg/dL (ref 0–40)

## 2022-04-18 LAB — HEMOGLOBIN A1C
Hgb A1c MFr Bld: 7.1 % — ABNORMAL HIGH (ref 4.8–5.6)
Mean Plasma Glucose: 157.07 mg/dL

## 2022-04-18 LAB — PREPARE PLATELET PHERESIS
Unit division: 0
Unit division: 0

## 2022-04-18 LAB — COMPREHENSIVE METABOLIC PANEL
ALT: 457 U/L — ABNORMAL HIGH (ref 0–44)
ALT: 428 U/L — ABNORMAL HIGH (ref 0–44)
AST: 1402 U/L — ABNORMAL HIGH (ref 15–41)
AST: 1324 U/L — ABNORMAL HIGH (ref 15–41)
Albumin: 2.4 g/dL — ABNORMAL LOW (ref 3.5–5.0)
Albumin: 2.5 g/dL — ABNORMAL LOW (ref 3.5–5.0)
Alkaline Phosphatase: 39 U/L (ref 38–126)
Alkaline Phosphatase: 36 U/L — ABNORMAL LOW (ref 38–126)
Anion gap: 16 — ABNORMAL HIGH (ref 5–15)
Anion gap: 15 (ref 5–15)
BUN: 17 mg/dL (ref 8–23)
BUN: 17 mg/dL (ref 8–23)
CO2: 20 mmol/L — ABNORMAL LOW (ref 22–32)
CO2: 21 mmol/L — ABNORMAL LOW (ref 22–32)
Calcium: 7.2 mg/dL — ABNORMAL LOW (ref 8.9–10.3)
Calcium: 7.2 mg/dL — ABNORMAL LOW (ref 8.9–10.3)
Chloride: 108 mmol/L (ref 98–111)
Chloride: 107 mmol/L (ref 98–111)
Creatinine, Ser: 2.35 mg/dL — ABNORMAL HIGH (ref 0.61–1.24)
Creatinine, Ser: 2.44 mg/dL — ABNORMAL HIGH (ref 0.61–1.24)
GFR, Estimated: 31 mL/min — ABNORMAL LOW (ref >60)
GFR, Estimated: 29 mL/min — ABNORMAL LOW (ref >60)
Glucose, Bld: 275 mg/dL — ABNORMAL HIGH (ref 70–99)
Glucose, Bld: 216 mg/dL — ABNORMAL HIGH (ref 70–99)
Potassium: 3 mmol/L — ABNORMAL LOW (ref 3.5–5.1)
Potassium: 3.1 mmol/L — ABNORMAL LOW (ref 3.5–5.1)
Sodium: 144 mmol/L (ref 135–145)
Sodium: 143 mmol/L (ref 135–145)
Total Bilirubin: 0.6 mg/dL (ref 0.3–1.2)
Total Bilirubin: 0.3 mg/dL (ref 0.3–1.2)
Total Protein: 4.2 g/dL — ABNORMAL LOW (ref 6.5–8.1)
Total Protein: 4.2 g/dL — ABNORMAL LOW (ref 6.5–8.1)

## 2022-04-18 LAB — GLUCOSE, CAPILLARY
Glucose-Capillary: 110 mg/dL — ABNORMAL HIGH (ref 70–99)
Glucose-Capillary: 116 mg/dL — ABNORMAL HIGH (ref 70–99)
Glucose-Capillary: 119 mg/dL — ABNORMAL HIGH (ref 70–99)
Glucose-Capillary: 138 mg/dL — ABNORMAL HIGH (ref 70–99)
Glucose-Capillary: 159 mg/dL — ABNORMAL HIGH (ref 70–99)
Glucose-Capillary: 159 mg/dL — ABNORMAL HIGH (ref 70–99)
Glucose-Capillary: 161 mg/dL — ABNORMAL HIGH (ref 70–99)
Glucose-Capillary: 165 mg/dL — ABNORMAL HIGH (ref 70–99)
Glucose-Capillary: 171 mg/dL — ABNORMAL HIGH (ref 70–99)
Glucose-Capillary: 177 mg/dL — ABNORMAL HIGH (ref 70–99)
Glucose-Capillary: 183 mg/dL — ABNORMAL HIGH (ref 70–99)
Glucose-Capillary: 199 mg/dL — ABNORMAL HIGH (ref 70–99)
Glucose-Capillary: 209 mg/dL — ABNORMAL HIGH (ref 70–99)
Glucose-Capillary: 243 mg/dL — ABNORMAL HIGH (ref 70–99)
Glucose-Capillary: 297 mg/dL — ABNORMAL HIGH (ref 70–99)
Glucose-Capillary: 37 mg/dL — CL (ref 70–99)
Glucose-Capillary: 88 mg/dL (ref 70–99)
Glucose-Capillary: 92 mg/dL (ref 70–99)
Glucose-Capillary: 97 mg/dL (ref 70–99)

## 2022-04-18 LAB — BPAM FFP
Blood Product Expiration Date: 202307102359
Blood Product Expiration Date: 202307102359
ISSUE DATE / TIME: 202307101437
ISSUE DATE / TIME: 202307101437
Unit Type and Rh: 6200
Unit Type and Rh: 6200

## 2022-04-18 LAB — GLOBAL TEG PANEL
CFF Max Amplitude: 18.9 mm (ref 15–32)
CK with Heparinase (R): 10.7 min — ABNORMAL HIGH (ref 4.3–8.3)
Citrated Functional Fibrinogen: 344.9 mg/dL (ref 278–581)
Citrated Kaolin (K): 3.3 min — ABNORMAL HIGH (ref 0.8–2.1)
Citrated Kaolin (MA): 55.3 mm (ref 52–69)
Citrated Kaolin (R): >17 min — ABNORMAL HIGH (ref 4.6–9.1)
Citrated Kaolin Angle: 57 deg — ABNORMAL LOW (ref 63–78)
Citrated Rapid TEG (MA): 59.4 mm (ref 52–70)

## 2022-04-18 LAB — ECHOCARDIOGRAM LIMITED
Height: 68 in
Weight: 4515.02 oz

## 2022-04-18 LAB — PREPARE RBC (CROSSMATCH)

## 2022-04-18 LAB — RENAL FUNCTION PANEL
Albumin: 2.4 g/dL — ABNORMAL LOW (ref 3.5–5.0)
Anion gap: 13 (ref 5–15)
BUN: 15 mg/dL (ref 8–23)
CO2: 26 mmol/L (ref 22–32)
Calcium: 6.9 mg/dL — ABNORMAL LOW (ref 8.9–10.3)
Chloride: 101 mmol/L (ref 98–111)
Creatinine, Ser: 2.38 mg/dL — ABNORMAL HIGH (ref 0.61–1.24)
GFR, Estimated: 30 mL/min — ABNORMAL LOW (ref >60)
Glucose, Bld: 96 mg/dL (ref 70–99)
Phosphorus: 3.7 mg/dL (ref 2.5–4.6)
Potassium: 3.2 mmol/L — ABNORMAL LOW (ref 3.5–5.1)
Sodium: 140 mmol/L (ref 135–145)

## 2022-04-18 LAB — CBC
HCT: 31.4 % — ABNORMAL LOW (ref 39.0–52.0)
HCT: 26.8 % — ABNORMAL LOW (ref 39.0–52.0)
Hemoglobin: 10.8 g/dL — ABNORMAL LOW (ref 13.0–17.0)
Hemoglobin: 9.4 g/dL — ABNORMAL LOW (ref 13.0–17.0)
MCH: 30.4 pg (ref 26.0–34.0)
MCH: 30.3 pg (ref 26.0–34.0)
MCHC: 34.4 g/dL (ref 30.0–36.0)
MCHC: 35.1 g/dL (ref 30.0–36.0)
MCV: 88.5 fL (ref 80.0–100.0)
MCV: 86.5 fL (ref 80.0–100.0)
Platelets: 141 10*3/uL — ABNORMAL LOW (ref 150–400)
Platelets: 115 10*3/uL — ABNORMAL LOW (ref 150–400)
RBC: 3.55 MIL/uL — ABNORMAL LOW (ref 4.22–5.81)
RBC: 3.1 MIL/uL — ABNORMAL LOW (ref 4.22–5.81)
RDW: 15.2 % (ref 11.5–15.5)
RDW: 15.4 % (ref 11.5–15.5)
WBC: 8.4 10*3/uL (ref 4.0–10.5)
WBC: 8.1 10*3/uL (ref 4.0–10.5)
nRBC: 0 % (ref 0.0–0.2)
nRBC: 0.2 % (ref 0.0–0.2)

## 2022-04-18 LAB — APTT
aPTT: >200 seconds (ref 24–36)
aPTT: 81 seconds — ABNORMAL HIGH (ref 24–36)
aPTT: 55 seconds — ABNORMAL HIGH (ref 24–36)
aPTT: 54 seconds — ABNORMAL HIGH (ref 24–36)

## 2022-04-18 LAB — BPAM PLATELET PHERESIS
Blood Product Expiration Date: 202307112359
Blood Product Expiration Date: 202307132359
ISSUE DATE / TIME: 202307101437
ISSUE DATE / TIME: 202307101437
Unit Type and Rh: 6200
Unit Type and Rh: 6200

## 2022-04-18 LAB — LACTIC ACID, PLASMA
Lactic Acid, Venous: 7.8 mmol/L (ref 0.5–1.9)
Lactic Acid, Venous: 7.2 mmol/L (ref 0.5–1.9)
Lactic Acid, Venous: 4.7 mmol/L (ref 0.5–1.9)
Lactic Acid, Venous: 3 mmol/L (ref 0.5–1.9)

## 2022-04-18 LAB — PREPARE FRESH FROZEN PLASMA
Unit division: 0
Unit division: 0

## 2022-04-18 LAB — LACTATE DEHYDROGENASE: LDH: 1992 U/L — ABNORMAL HIGH (ref 98–192)

## 2022-04-18 LAB — PHOSPHORUS: Phosphorus: 2.9 mg/dL (ref 2.5–4.6)

## 2022-04-18 LAB — POTASSIUM: Potassium: 2.8 mmol/L — ABNORMAL LOW (ref 3.5–5.1)

## 2022-04-18 LAB — MAGNESIUM
Magnesium: 1.4 mg/dL — ABNORMAL LOW (ref 1.7–2.4)
Magnesium: 2.1 mg/dL (ref 1.7–2.4)
Magnesium: 1.5 mg/dL — ABNORMAL LOW (ref 1.7–2.4)

## 2022-04-18 LAB — HEPARIN LEVEL (UNFRACTIONATED): Heparin Unfractionated: 0.3 IU/mL (ref 0.30–0.70)

## 2022-04-18 LAB — PROTIME-INR
INR: 1.3 — ABNORMAL HIGH (ref 0.8–1.2)
Prothrombin Time: 15.7 seconds — ABNORMAL HIGH (ref 11.4–15.2)

## 2022-04-18 LAB — FIBRINOGEN: Fibrinogen: 309 mg/dL (ref 210–475)

## 2022-04-18 MED ORDER — CALCIUM GLUCONATE-NACL 1-0.675 GM/50ML-% IV SOLN
1.0000 g | Freq: Once | INTRAVENOUS | Status: AC
Start: 2022-04-18 — End: 2022-04-18
  Administered 2022-04-18: 1000 mg via INTRAVENOUS
  Filled 2022-04-18: qty 50

## 2022-04-18 MED ORDER — MAGNESIUM SULFATE 4 GM/100ML IV SOLN
4.0000 g | Freq: Once | INTRAVENOUS | Status: AC
  Administered 2022-04-18: 4 g via INTRAVENOUS
  Filled 2022-04-18: qty 100

## 2022-04-18 MED ORDER — AMIODARONE HCL 200 MG PO TABS
200.0000 mg | ORAL_TABLET | Freq: Two times a day (BID) | ORAL | Status: DC
  Administered 2022-04-19 – 2022-04-21 (×5): 200 mg
  Filled 2022-04-18 (×6): qty 1

## 2022-04-18 MED ORDER — SODIUM CHLORIDE 0.9 % IV SOLN
0.0320 mg/kg/h | INTRAVENOUS | Status: DC
  Administered 2022-04-18: 0.02 mg/kg/h via INTRAVENOUS
  Administered 2022-04-20: 0.032 mg/kg/h via INTRAVENOUS
  Filled 2022-04-18 (×2): qty 250

## 2022-04-18 MED ORDER — CALCIUM GLUCONATE-NACL 1-0.675 GM/50ML-% IV SOLN
1.0000 g | Freq: Once | INTRAVENOUS | Status: AC
Start: 2022-04-18 — End: 2022-04-18
  Administered 2022-04-18: 1000 mg via INTRAVENOUS

## 2022-04-18 MED ORDER — PERFLUTREN LIPID MICROSPHERE
1.0000 mL | INTRAVENOUS | Status: AC | PRN
  Administered 2022-04-18: 2 mL via INTRAVENOUS

## 2022-04-18 MED ORDER — POTASSIUM CHLORIDE 10 MEQ/50ML IV SOLN
10.0000 meq | INTRAVENOUS | Status: AC
  Administered 2022-04-18 – 2022-04-19 (×5): 10 meq via INTRAVENOUS

## 2022-04-18 MED ORDER — POTASSIUM CHLORIDE 10 MEQ/100ML IV SOLN: 10.0000 meq | INTRAVENOUS | Status: DC

## 2022-04-18 MED ORDER — POTASSIUM CHLORIDE 10 MEQ/50ML IV SOLN
INTRAVENOUS | Status: AC
  Filled 2022-04-18: qty 300

## 2022-04-18 MED ORDER — POTASSIUM CHLORIDE 20 MEQ PO PACK
40.0000 meq | PACK | Freq: Once | ORAL | Status: AC
  Administered 2022-04-18: 40 meq
  Filled 2022-04-18: qty 2

## 2022-04-18 MED ORDER — MAGNESIUM SULFATE 4 GM/100ML IV SOLN
4.0000 g | Freq: Once | INTRAVENOUS | Status: AC
Start: 2022-04-19 — End: 2022-04-19
  Administered 2022-04-18: 4 g via INTRAVENOUS
  Filled 2022-04-18: qty 100

## 2022-04-18 MED ORDER — HEPARIN (PORCINE) 25000 UT/250ML-% IV SOLN
800.0000 [IU]/h | INTRAVENOUS | Status: DC
  Administered 2022-04-18: 800 [IU]/h via INTRAVENOUS

## 2022-04-18 MED ORDER — INSULIN ASPART 100 UNIT/ML IJ SOLN
3.0000 [IU] | INTRAMUSCULAR | Status: DC
  Administered 2022-04-18 – 2022-04-19 (×6): 6 [IU] via SUBCUTANEOUS
  Administered 2022-04-19 – 2022-04-21 (×6): 3 [IU] via SUBCUTANEOUS
  Administered 2022-04-21 (×2): 6 [IU] via SUBCUTANEOUS
  Administered 2022-04-21: 9 [IU] via SUBCUTANEOUS

## 2022-04-18 MED ORDER — STERILE WATER FOR INJECTION IV SOLN
INTRAVENOUS | Status: DC
  Filled 2022-04-18 (×2): qty 150

## 2022-04-18 MED ORDER — ATORVASTATIN CALCIUM 80 MG PO TABS
80.0000 mg | ORAL_TABLET | Freq: Every day | ORAL | Status: DC
  Administered 2022-04-18 – 2022-04-21 (×4): 80 mg
  Filled 2022-04-18 (×4): qty 1

## 2022-04-18 MED ORDER — ASPIRIN 81 MG PO CHEW
81.0000 mg | CHEWABLE_TABLET | Freq: Every day | ORAL | Status: DC
  Administered 2022-04-18 – 2022-04-21 (×4): 81 mg
  Filled 2022-04-18 (×4): qty 1

## 2022-04-18 MED ORDER — DEXTROSE 50 % IV SOLN
1.0000 | Freq: Once | INTRAVENOUS | Status: AC
  Administered 2022-04-18: 50 mL via INTRAVENOUS
  Filled 2022-04-18: qty 50

## 2022-04-18 MED ORDER — INSULIN DETEMIR 100 UNIT/ML ~~LOC~~ SOLN
20.0000 [IU] | Freq: Two times a day (BID) | SUBCUTANEOUS | Status: DC
  Administered 2022-04-18 – 2022-04-21 (×6): 20 [IU] via SUBCUTANEOUS
  Filled 2022-04-18 (×10): qty 0.2

## 2022-04-18 MED ORDER — PRISMASOL BGK 4/2.5 32-4-2.5 MEQ/L REPLACEMENT SOLN: Status: DC

## 2022-04-18 MED ORDER — POTASSIUM CHLORIDE 10 MEQ/50ML IV SOLN
10.0000 meq | INTRAVENOUS | Status: AC
  Administered 2022-04-18 (×4): 10 meq via INTRAVENOUS
  Filled 2022-04-18 (×4): qty 50

## 2022-04-18 MED ORDER — AMIODARONE HCL IN DEXTROSE 360-4.14 MG/200ML-% IV SOLN
30.0000 mg/h | INTRAVENOUS | Status: DC
  Administered 2022-04-18 – 2022-04-19 (×2): 30 mg/h via INTRAVENOUS
  Filled 2022-04-18 (×2): qty 200

## 2022-04-18 MED ORDER — POTASSIUM CHLORIDE 10 MEQ/50ML IV SOLN: 10.0000 meq | INTRAVENOUS | Status: DC

## 2022-04-18 MED ORDER — ALBUMIN HUMAN 25 % IV SOLN
12.5000 g | Freq: Once | INTRAVENOUS | Status: AC
  Administered 2022-04-18: 12.5 g via INTRAVENOUS
  Filled 2022-04-18: qty 50

## 2022-04-18 MED ORDER — MAGNESIUM SULFATE 2 GM/50ML IV SOLN
2.0000 g | Freq: Once | INTRAVENOUS | Status: DC
  Filled 2022-04-18: qty 50

## 2022-04-18 MED FILL — Cangrelor Tetrasodium For IV Soln 50 MG: INTRAVENOUS | Qty: 50 | Status: AC

## 2022-04-18 NOTE — Progress Notes (Signed)
Patient ID: James White, male   DOB: [birth year], 61 y.o.   MRN: [id number] Extracorporeal support note   ECLS support day: 2 Indication: Cardiogenic shock  Configuration: VA ECMO. Distal perfusion right and left SFAs.   Drainage cannula: R femoral vein 23 F Return cannula: L femoral artery 19 F  Pump speed: 3625 rpm Pump flow: 3.76 L/min Cardiohelp  Sweep gas: 8  Circuit check: Stable Anticoagulant: heparin Anticoagulation targets: heparin level 0.3-0.5  Changes in support: Stable today  Anticipated goals/duration of support: Wean off  Marca Ancona MD 04/18/2022, 7:55 AM

## 2022-04-18 NOTE — Progress Notes (Addendum)
ABG    Component Value Date/Time   PHART 7.396 04/18/2022 2008   PCO2ART 47.6 04/18/2022 2008   PO2ART 60 (L) 04/18/2022 2008   HCO3 29.4 (H) 04/18/2022 2008   TCO2 31 04/18/2022 2008   ACIDBASEDEF 1.0 04/18/2022 0803   O2SAT 91 04/18/2022 2008   With increased pulsatility saturations are dropping, PCO2 rising.  Discussed with Dr. Shirlee Latch, ventilator settings adjusted to more conventional settings.  Pressure control increased to 20, respiratory rate increased to 20.  Repeat ABG in 1 hour.  Transition from insulin infusion to basal bolus.  Has received about 80 units of insulin since 8:00 this morning.  Will start Levemir 20 units twice daily plus resistant scale sliding scale insulin.  Steffanie Dunn, DO 04/18/22 10:09 PM [facility]

## 2022-04-18 NOTE — Progress Notes (Signed)
Patient ID: JAHID WEIDA, male   DOB: 04-Feb-1961, 61 y.o.   MRN: [id number]     Advanced Heart Failure Rounding Note  PCP-Cardiologist: None   Subjective:    7/10: LAD PCI.  Temporary wire placement.  Impella placed. VA ECMO cannulation.   Stable MAP on NE 9, epinephrine 8.  Off vasopressin. Weight up significantly, CVP around 20.  HCO3 gtt ongoing.   Cefepime coverage for aspiration PNA.   CVVH running even overnight.   Patient had 1 unit PRBCs overnight, hgb 10.8.  Left groin site stable without further overt bleeding.   Patient remains paralyzed on cisatracurium.   NSR on amiodarone gtt, no further VT.   ECMO: 3625 rpm Flow 3.76 Pven -106 DeltaP 19 ABG 7.43/31/148, O2 sat 99% LDH 1992 Lactate >9 => 7.8 Vent FiO2 0.4 Heparin gtt with goal heparin level 0.3-0.5  Impella CP: P1  Flow 0.9 L/min   Objective:   Weight Range: 128 kg Body mass index is 42.91 kg/m.   Vital Signs:   Temp:  [96.3 F (35.7 C)-99.1 F (37.3 C)] 97.2 F (36.2 C) (07/11 0700) Pulse Rate:  [28-123] 80 (07/11 0731) Resp:  [0-38] 28 (07/11 0731) BP: (61-113)/(28-86) 76/68 (07/11 0731) SpO2:  [56 %-100 %] 98 % (07/11 0731) Arterial Line BP: (65-112)/(56-88) 75/67 (07/11 0700) FiO2 (%):  [40 %-100 %] 40 % (07/11 0731) Weight:  [270 kg] 128 kg (07/11 0500) Last BM Date :  (PTA)  Weight change: Filed Weights   05/02/2022 0724 04/18/22 0500  Weight: 109.3 kg 128 kg    Intake/Output:   Intake/Output Summary (Last 24 hours) at 04/18/2022 0738 Last data filed at 04/18/2022 0700 Gross per 24 hour  Intake 7576.96 ml  Output 1369 ml  Net 6207.96 ml      Physical Exam    General:  Paralyzed/sedated on vent HEENT: Normal Neck: Thick, cannot assess JVD. Carotids 2+ bilat; no bruits. No lymphadenopathy or thyromegaly appreciated. Cor: PMI nondisplaced. Regular rate & rhythm. No rubs, gallops or murmurs. Lungs: Clear Abdomen: Soft, nontender, moderately distended. No hepatosplenomegaly.  No bruits or masses. Good bowel sounds. Extremities: No cyanosis, clubbing, rash.  1+ ankle edema. Neuro: Sedated on vent   Telemetry   V-paced rate 80, personally reviewed.   Labs    CBC Recent Labs    04/16/2022 0730 04/09/2022 1020 04/16/2022 2106 04/18/2022 2107 04/18/22 0300 04/18/22 0307 04/18/22 0600  WBC 10.2   < > 8.6  --  8.4  --   --   NEUTROABS 5.4  --   --   --   --   --   --   HGB 13.0   < > 10.9*   < > 10.8* 10.2* 8.8*  HCT 38.2*   < > 32.6*   < > 31.4* 30.0* 26.0*  MCV 92.7   < > 90.6  --  88.5  --   --   PLT 276   < > 174  --  141*  --   --    < > = values in this interval not displayed.   Basic Metabolic Panel Recent Labs    04/16/2022 1741 05/02/2022 1812 04/18/22 0110 04/18/22 0116 04/18/22 0300 04/18/22 0307 35/00/93 8182  NA DUPLICATE REQUEST  993   < > 144   < > 716 967 893  K DUPLICATE REQUEST  4.0   < > 3.0*   < > 3.1* 3.1* 3.2*  CL DUPLICATE REQUEST  810  --  108  --  107  --   --   CO2 DUPLICATE REQUEST  16*  --  20*  --  21*  --   --   GLUCOSE DUPLICATE REQUEST  056*  --  275*  --  216*  --   --   BUN DUPLICATE REQUEST  21  --  17  --  17  --   --   CREATININE DUPLICATE REQUEST  9.79*  --  2.35*  --  2.44*  --   --   CALCIUM DUPLICATE REQUEST  6.0*  --  7.2*  --  7.2*  --   --   MG  --   --  1.4*  --  2.1  --   --   PHOS 7.7*  --   --   --  2.9  --   --    < > = values in this interval not displayed.   Liver Function Tests Recent Labs    04/18/22 0110 04/18/22 0300  AST 1,402* 1,324*  ALT 457* 428*  ALKPHOS 39 36*  BILITOT 0.6 0.3  PROT 4.2* 4.2*  ALBUMIN 2.4* 2.5*   No results for input(s): "LIPASE", "AMYLASE" in the last 72 hours. Cardiac Enzymes No results for input(s): "CKTOTAL", "CKMB", "CKMBINDEX", "TROPONINI" in the last 72 hours.  BNP: BNP (last 3 results) No results for input(s): "BNP" in the last 8760 hours.  ProBNP (last 3 results) No results for input(s): "PROBNP" in the last 8760 hours.   D-Dimer No  results for input(s): "DDIMER" in the last 72 hours. Hemoglobin A1C Recent Labs    04/18/22 0300  HGBA1C 7.1*   Fasting Lipid Panel Recent Labs    04/18/22 0300  CHOL 149  HDL 25*  LDLCALC 86  TRIG 191*  CHOLHDL 6.0   Thyroid Function Tests No results for input(s): "TSH", "T4TOTAL", "T3FREE", "THYROIDAB" in the last 72 hours.  Invalid input(s): "FREET3"  Other results:   Imaging    DG Chest Port 1 View  Result Date: 04/23/2022 CLINICAL DATA:  Respiratory failure with intubation, lines placed, ECMO. EXAM: PORTABLE CHEST 1 VIEW COMPARISON:  Portable chest earlier today at 7:50 a.m. FINDINGS: 7:59 p.m. ETT has been inserted and the tip is 4.9 cm from the carina. NGT enters the stomach but the intragastric course is not filmed. Small caliber catheter through a right IJ introducer sheath is difficult to follow distally but I believe it terminates in the right ventricle. There is a left IJ approach catheter terminating at the brachiocephalic/SVC junction, left subclavian approach double-lumen catheter terminating in the upper SVC. On the left there is overlying wiring attached to a partially translucent electrical pad superimposing over the base. Small cylindrical electronic device superimposing at the level of the carina and proximal ascending aorta is attached to 2 thin parallel wires roughly paralleling the course of the thoracic aorta and extending off of the film in the upper abdomen. The heart is enlarged. There is increased moderate vascular engorgement, perihilar alveolar opacities radiating outward on the right-greater-than-left, and increased moderate right and small left pleural effusions. No pneumothorax is seen. IMPRESSION: 1. Moderate vascular engorgement and perihilar edema on the right-greater-than-left have developed since the earlier film from today. Progress chest films recommended depending on clinical response. 2. Right-greater-than-left pleural effusions. 3. Support  apparatus as detailed above. Electronically Signed   By: Telford Nab M.D.   On: 04/29/2022 20:20   CARDIAC CATHETERIZATION  Result Date: 04/25/2022 Images from the original result were not included.  Prox RCA lesion is 80% stenosed.   Ost Cx to Prox Cx lesion is 100% stenosed.   Mid LM to Prox LAD lesion is 80% stenosed.   Prox LAD lesion is 95% stenosed.   A stent was successfully placed using a SYNERGY XD [id number].   Post intervention, there is a 0% residual stenosis.   Post intervention, there is a 0% residual stenosis.   There is moderate to severe left ventricular systolic dysfunction.   LV end diastolic pressure is severely elevated.   The left ventricular ejection fraction is 35-45% by visual estimate. HEINRICH FERTIG is a 61 y.o. male  [id number] LOCATION:  FACILITY: [facility] PHYSICIAN: Quay Burow, M.D. 1960-11-06 DATE OF PROCEDURE:  04/16/2022 DATE OF DISCHARGE: CARDIAC CATHETERIZATION Marjorie Smolder drug-eluting stent LAD History obtained from chart review.  61 year old African-American male with history of diabetes and hypertension who collapsed at work.  He is proxy [facility] nursing room where he was found to be in complete heart block, and cardiogenic shock.  He is brought to the Cath Lab urgently for right left heart cath. PROCEDURE DESCRIPTION: The patient was brought to the second floor [facility] cath lab in the postabsorptive state. He was premedicated with IV Versed and fentanyl. His right groin was prepped and shaved in usual sterile fashion. Xylocaine 1% was used for local anesthesia. A 5 French sheath was inserted into the right common femoral artery using standard Seldinger technique.  Ultrasound was used to identify the right common femoral artery and guide access.  A digital image was captured and placed the patient's chart.  A 7 French sheath was inserted into the right common femoral vein.  A 7 [place] balloontipped stimulation Swan-Ganz catheter was then advanced to the right  heart chambers obtaining sequential pressures and pulmonary blood samples for the determination of cardiac output.  5 French right left second sinus and catheters were used for selective coronary angiography and left ventriculography speculate.  Isovue dye is used for the entirety of the case.  Retrograde aortic, ventricular and pullback pressures were recorded.  The LVEDP was measured at 38. The patient was persistently hypotensive and hypoxic.  The decision was made to place a Impella CP prior to intervention.  This was performed with the assistance of Dr. Gerald Stabs End who also placed a temporary transvenous pacemaker in the right IJ. The patient was placed on cangrelor drip and received heparin bolus followed by infusion with a therapeutic ACT.  I then stuck the Impella diaphragm with a micropuncture needle and placed a long 6 French sheath alongside the Impella.  I then used a 6 [place] XB LAD 3.0 cm guide cath along the 0.14 [place] guidewire a 2.5 mm x 5012 mm long balloon to predilate the entire left main and proximal LAD.  I then placed a 3.5 x 38 mm Synergy drug-eluting stent across the distal left main into the proximal LAD and deployed at 14 atm Berry overlapped a 3.5 x 16 mm Synergy drug-eluting stent from the ostium of the left left main into the proximal mLAD and postdilated the entire stented segment with a 4 mm x 15 mm long noncompliant balloon resulting in reduction of a long 80 and focal 95% hypodense lesion to 0% residual TIMI-3 flow. HEMODYNAMICS:  1: LVEDP-38 2: Right atrial pressure-35/33 3: RV pressure-70/17 4: Pulmonary pressure-70/20 5: Pulmonary wedge pressure-A-wave 56, V wave 54, mean 48 6: Cardiac output-3.2 L/min with an index of 1.45 L/min per metered squared   Mr.  Shirah had cardiogenic shock probably related to an occluded circumflex.  We could never find the origin.  He had a small RCA with 80% segmental proximal stenosis.  He had successful stenting of his left main and distal LAD.   Because of inability to oxygenate despite Impella he was placed on VA ECMO.  Dr. Aundra Dubin will dictate the ECMO note.  He was placed on cangrelor, IV heparin as well and will be converted to p.o. Brilinta once an OG tube was placed and position was verified. Quay Burow. MD, [facility] 04/15/2022 2:52 PM    ECHOCARDIOGRAM LIMITED  Result Date: 04/28/2022    ECHOCARDIOGRAM LIMITED REPORT   Patient Name:   NICOLI NARDOZZI Calvi Date of Exam: 04/20/2022 Medical Rec #:  [id number]     Height:       68.0 in Accession #:    [id number]    Weight:       241.0 lb Date of Birth:  1961/04/17     BSA:          2.212 m Patient Age:    17 years      BP:           68/52 mmHg Patient Gender: M             HR:           70 bpm. Exam Location:  Inpatient Procedure: Limited Echo and Intracardiac Opacification Agent STAT ECHO Indications:    Cardiac Arrest i46.9  History:        Patient has no prior history of Echocardiogram examinations.                 Risk Factors:Hypertension, Diabetes and Dyslipidemia.  Sonographer:    Maudry Mayhew RDMS, RVT, RDCS Referring Phys: Ina Homes MD  Sonographer Comments: Stat echo performed in cath lab during impella and ecmo cannulation IMPRESSIONS  1. Left ventricular ejection fraction, by estimation, is 40 to 45%. The left ventricle has mildly decreased function. Normal apical function, hypokinesis in basal segments  2. Right ventricular systolic function is mildly reduced. The right ventricular size is normal.  3. Impella measures 4.4cm into LV from aortic valve FINDINGS  Left Ventricle: Left ventricular ejection fraction, by estimation, is 40 to 45%. The left ventricle has mildly decreased function. The left ventricle demonstrates regional wall motion abnormalities. Definity contrast agent was given IV to delineate the left ventricular endocardial borders. Right Ventricle: The right ventricular size is normal. Right ventricular systolic function is mildly reduced. Oswaldo Milian MD  Electronically signed by Oswaldo Milian MD Signature Date/Time: 04/18/2022/4:44:35 PM    Final    DG Chest Port 1 View  Result Date: 04/24/2022 CLINICAL DATA:  Pt to ED from work c/o witnessed syncopal episode. Diaphoretic on arrival, cbg 286. Hypotensive at 76/57. Bradycardia. EXAM: PORTABLE CHEST 1 VIEW COMPARISON:  None Available. FINDINGS: Cardiac silhouette is normal in size. No mediastinal or hilar masses. Clear lungs. No pleural effusion or gross pneumothorax on this supine exam. Skeletal structures are grossly intact IMPRESSION: No active disease. Electronically Signed   By: Lajean Manes M.D.   On: 05/01/2022 07:58     Medications:     Scheduled Medications:  artificial tears  1 Application Both Eyes I7T   aspirin  81 mg Oral Daily   atorvastatin  80 mg Oral Daily   Chlorhexidine Gluconate Cloth  6 each Topical Daily   cisatracurium  0.1 mg/kg Intravenous Once   fentaNYL (SUBLIMAZE) injection  50 mcg  Intravenous Once   ipratropium-albuterol  3 mL Nebulization Q4H   mouth rinse  15 mL Mouth Rinse Q2H   pantoprazole (PROTONIX) IV  40 mg Intravenous QHS   sodium chloride flush  10-40 mL Intracatheter Q12H   sodium chloride flush  3 mL Intravenous Q12H   sodium chloride flush  3 mL Intravenous Q12H   ticagrelor  90 mg Per Tube BID    Infusions:   prismasol BGK 4/2.5 400 mL/hr at 05/06/2022 2027    prismasol BGK 4/2.5 400 mL/hr at 04/08/2022 2026   sodium chloride 10 mL/hr at 04/18/22 0700   sodium chloride     sodium chloride     sodium chloride Stopped (04/16/2022 1846)   albumin human Stopped (04/18/22 0517)   amiodarone 30 mg/hr (04/18/22 0700)   amiodarone     cisatracurium (NIMBEX) 200 mg in sodium chloride 0.9 % 100 mL (2 mg/mL) infusion 1 mcg/kg/min (04/18/22 0700)   dextrose 5 % and 0.45% NaCl     epinephrine 8 mcg/min (04/18/22 0700)   fentaNYL infusion INTRAVENOUS 100 mcg/hr (04/18/22 0700)   heparin 800 Units/hr (04/18/22 0700)   insulin 14 Units/hr (04/18/22  0700)   meropenem (MERREM) IV Stopped (04/18/22 0603)   midazolam 8 mg/hr (04/18/22 0700)   norepinephrine (LEVOPHED) Adult infusion 9 mcg/min (04/18/22 0700)   prismasol BGK 4/2.5 1,500 mL/hr at 04/18/22 0636   sodium bicarbonate 150 mEq in sterile water 1,150 mL infusion 125 mL/hr at 04/18/22 0700   sodium bicarbonate 25 mEq (Impella PURGE) in dextrose 5 % 1000 mL bag     vasopressin Stopped (04/18/22 0411)    PRN Medications: sodium chloride, sodium chloride, acetaminophen, albumin human, dextrose, etomidate, fentaNYL, heparin, midazolam, midazolam, midazolam, ondansetron (ZOFRAN) IV, mouth rinse, sodium chloride, sodium chloride flush, sodium chloride flush, succinylcholine    Assessment/Plan   1. Cardiogenic shock: Ischemic cardiomyopathy.  After PCI to LM/LAD, Impella, and VA ECMO, the LV EF appeared to be around 40% with mild RV systolic dysfunction.  Patient is currently on epinephrine 8, norepinephrine 9, off vasopressin.  Impella for LV vent at P1 with flow 0.9 L/min.  VA ECMO ongoing, stable overnight without chugging.  Marked volume overload with weight up and CVP 20.  - Will start to pull fluid via CVVH this morning.  Will also stop HCO3 gtt and manage via CVVH.   - Wean pressors as able.  - Continue Impella P1 - Continue VA ECMO.  - Transition to bivalirudin gtt for ECMO and Impella.  - Echo today.  2. CAD: Presented with posterior MI and occluded ostial LCx as likely culprit.  Unable to wire the LCx.  Up to 95% stenosis left main into LAD, DES LM to proximal LAD.   - Continue ticagrelor.  - statin.  - ASA 81.  3. VT arrest: In setting of bleeding from left groin.  DCCV x 1 with no CPR.  - Transition amiodarone to per tube.   4. PEA arrest: Post-PCI.  Required about 2 minutes CPR then resolved with epinephrine/HCO3.  5. Acute hypoxemic respiratory failure: Volume overload/pulmonary edema.  Now managed by VA ECMO.  CXR with pulmonary edema.  Vent FiO2 to 0.4 and ABG  improved. CCM following.  - Paralyzed.  - Vent per CCM.  - Needs volume removed via CVVH, see above.  - Covering for possible aspiration PNA with cefepime.  6. Arterial cannula site bleeding: Bolsters sutured in place, bleeding stopped.  VVS following.  7. Anemia: Hgb 10.8 this morning.  Transfuse to keep hgb > 8.  8. AKI on ?CKD 3: Creatinine 1.6 at admission, unsure of baseline.  Contrast and hypotension/shock. CVVH started on 7/10. Made 1150 cc urine yesterday.  - Will need CVVH as above.  9. Thrombocytopenia: Critical illness and blood products.  Mild so far.  - Transition heparin gtt to bivalirudin.  10. Type 2 diabetes: Insulin gtt.  11. Complete heart block: Has temporary transvenous pacemaker now, likely related to posterior MI.  Currently pacing rate 80. 12. FEN: No tube feeds today, currently paralyzed with cisatracurium.  Will need Cortrack likely tomorrow.   CRITICAL CARE Performed by: Loralie Champagne  Total critical care time: 45 minutes  Critical care time was exclusive of separately billable procedures and treating other patients.  Critical care was necessary to treat or prevent imminent or life-threatening deterioration.  Critical care was time spent personally by me on the following activities: development of treatment plan with patient and/or surrogate as well as nursing, discussions with consultants, evaluation of patient's response to treatment, examination of patient, obtaining history from patient or surrogate, ordering and performing treatments and interventions, ordering and review of laboratory studies, ordering and review of radiographic studies, pulse oximetry and re-evaluation of patient's condition.     Length of Stay: 1  Loralie Champagne, MD  04/18/2022, 7:38 AM  Advanced Heart Failure Team Pager [phone] (M-F; 7a - 5p)  Please contact [facility] for night-coverage after hours (5p -7a ) and weekends on [email or web address]

## 2022-04-18 NOTE — Progress Notes (Signed)
ANTICOAGULATION CONSULT NOTE - Follow Up Consult  Pharmacy Consult for heparin Indication:  ECMO + Impella  Labs: Recent Labs    04/19/2022 0730 04/25/2022 0934 04/25/2022 1020 04/16/2022 1137 04/30/2022 1741 04/08/2022 1812 04/20/2022 2106 Apr 21, 2022 2107 04/14/2022 2301 04/29/2022 2302 04/18/22 0110 04/18/22 0116 04/18/22 0300 04/18/22 0305 04/18/22 0307  HGB 13.0  --  15.0   < > 9.8*   < > 10.9*   < >  --    < >  --  11.2* 10.8*  --  10.2*  HCT 38.2*  --  44.0   < > 29.4*   < > 32.6*   < >  --    < >  --  33.0* 31.4*  --  30.0*  PLT 276  --   --   --  162  --  174  --   --   --   --   --  141*  --   --   APTT  --   --   --    < > >200*  --  >200*  --  >200*  --   --   --  81*  --   --   LABPROT  --  12.6  --   --  19.2*  --   --   --   --   --   --   --  15.7*  --   --   INR  --  1.0  --   --  1.6*  --   --   --   --   --   --   --  1.3*  --   --   HEPARINUNFRC  --   --   --   --  >1.10*  --   --   --  >1.10*  --   --   --   --  0.30  --   CREATININE 1.62*  --  2.10*  --  DUPLICATE REQUEST  2.52*  --   --   --   --   --  2.35*  --   --   --   --   TROPONINIHS 99* 1,198*  --   --   --   --  >24,000*  --   --   --   --   --   --   --   --    < > = values in this interval not displayed.    Assessment: 61yo male had been supratherapeutic on heparin after return from lab with ECMO and Impella, was bleeding from cannulation site, now more stable.  Pt had rec'd total of 30,000 units of heparin in lab during case and given AKI requiring CRRT likely was not clearing the heparin very quickly.  Heparin level and PTT are now normalizing >> now appropriate to start systemic heparin.  Per ECMO specialist circuit is flowing well with no clots/fibrin seen.  Goal of Therapy:  Heparin level 0.3-0.5 units/ml   Plan:  Will resume heparin at lower rate of 800 units/hr (8-10 units/kg ABW/hr) and check level in 6 hours.    Vernard Gambles, PharmD, BCPS  04/18/2022,4:44 AM

## 2022-04-18 NOTE — Progress Notes (Signed)
ANTICOAGULATION CONSULT NOTE  Pharmacy Consult for heparin to bivalirudin Indication:  ECMO + Impella  No Known Allergies  Patient Measurements: Height: 5\' 8"  (172.7 cm) Weight: 128 kg (282 lb 3 oz) IBW/kg (Calculated) : 68.4 Heparin Dosing Weight: 93kg  Vital Signs: Temp: 97.2 F (36.2 C) (07/11 0700) Temp Source: Bladder (07/11 0400) BP: 76/68 (07/11 0731) Pulse Rate: 80 (07/11 0731)  Labs: Recent Labs    04/12/2022 0730 04/16/2022 0934 05/08/2022 1020 05/02/2022 1741 05/07/2022 1812 05/01/2022 2106 04/08/2022 2107 04/08/2022 2301 04/23/2022 2302 04/18/22 0110 04/18/22 0116 04/18/22 0300 04/18/22 0305 04/18/22 0307 04/18/22 0600  HGB 13.0  --    < > 9.8*   < > 10.9*   < >  --    < >  --    < > 10.8*  --  10.2* 8.8*  HCT 38.2*  --    < > 29.4*   < > 32.6*   < >  --    < >  --    < > 31.4*  --  30.0* 26.0*  PLT 276  --   --  162  --  174  --   --   --   --   --  141*  --   --   --   APTT  --   --    < > >200*  --  >200*  --  >200*  --   --   --  81*  --   --   --   LABPROT  --  12.6  --  19.2*  --   --   --   --   --   --   --  15.7*  --   --   --   INR  --  1.0  --  1.6*  --   --   --   --   --   --   --  1.3*  --   --   --   HEPARINUNFRC  --   --   --  >1.10*  --   --   --  >1.10*  --   --   --   --  0.30  --   --   CREATININE 1.62*  --    < > DUPLICATE REQUEST  2.52*  --   --   --   --   --  2.35*  --  2.44*  --   --   --   TROPONINIHS 99* 1,198*  --   --   --  >24,000*  --   --   --   --   --   --   --   --   --    < > = values in this interval not displayed.     Estimated Creatinine Clearance: 41.5 mL/min (A) (by C-G formula based on SCr of 2.44 mg/dL (H)).   Medical History: Past Medical History:  Diagnosis Date   High cholesterol    Hypertension      Assessment: 4 yoM admitted with CHB and cardiogenic shock s/p Impella CP and ECMO cannulation. Pt started on heparin 1200 units/h in cath lab for anticoagulation, pharmacy asked to transition to bivalirudin.  aPTT  and heparin level extremely elevated overnight, now normalized this morning. Pt on CRRT, will begin to pull today. Bleeding has stabilized.  Goal of Therapy:  Heparin level 0.3-0.5 units/ml Monitor platelets by anticoagulation protocol: Yes   Plan:  Stop heparin  Start bivalirudin 0.02 mg/kg/hr 1 hour after stopping heparin to allow clearance Continue bicarbonate in Impella purge  Fredonia Highland, PharmD, BCPS, [facility] Clinical Pharmacist [phone] Please check AMION for all [facility] numbers 04/18/2022

## 2022-04-18 NOTE — Assessment & Plan Note (Signed)
Minimal urine output.  Now on CRRT.  - Run fluid even today. Initiation of ECMO will likely limit the ability to pull fluid initially.

## 2022-04-18 NOTE — Assessment & Plan Note (Signed)
Following STEMI. Now requiring ECMO support. Impella for LV venting. Improving LV function by echo.  -Continue ECMO support and monitor for LV recovery.  - Wean E/NE to keep MAP >70 and maintain 10 of pulsatility - Improving pulsatility - Echo tomorrow to assess recovery.

## 2022-04-18 NOTE — Progress Notes (Signed)
Initial Nutrition Assessment   INTERVENTION:   Recommend initiation of early EN, even if just trickles, within 24-48 hours  Consider Cortrak placement when able  Tube Feeding Recommendations:  Pivot 1.5 at 60 ml/hr Begin at 20 ml/hr; titrate by 10 mL q 8 hours until goal rate of 60 ml/hr Pro-Source TF 45 mL TID Goal regimen provides 164 g of protein, 2280 kcals and 1094 mL of free water  Add Renal MVI daily  NUTRITION DIAGNOSIS:   Inadequate oral intake related to acute illness as evidenced by NPO status.  GOAL:   Patient will meet greater than or equal to 90% of their needs   MONITOR:   TF tolerance, Vent status, Labs, Weight trends, Skin  REASON FOR ASSESSMENT:   Consult, Ventilator Assessment of nutrition requirement/status (ECMO)  ASSESSMENT:   61 yo male admitted with syncopal episode, bradycardia with complete HB, intubated,  developed cardiogenic shock and taken to cath lab for emergent heart cath, PCI.  Impella placed for cardiogenic shock, ultimately required cannulation for VA ECMO. PMH includes HTN, HLD  7/10 Intubated,brief arrest post PCI, impella placed for persistent shock, VA ECMO via bi-fem cannulation. Bleeding from left arterial cannulation   Pt remains sedated on vent support, Off paralytic currently VA ECMO day 2, femoral cannulation, Sweep 8 Impella CRRT wih UF 180 ml/hr, plans to increase as able Requiring levophed 11 mcg/min, epinephrine 8 mcg/min). Currently off vasopressin, dopamine.  Lactic acid trending down  OG tube in place  Pt's sister, Angelique Blonder, at bedside. Angelique Blonder reports pt with good appetite eating well PTA with no wt loss. Pt is single and lives at home alone. Pt does cook but not much, eats out a lot. 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's job is physically demanding and is on his feet most of the day  Current wt 128 kg; net + 6 L  Labs: potassium 3.2 (L), Creatinine 2.44, BUN wdl Meds: insulin drip,  D5-1/2 NS at 75 ml/hr, sodium bicarb at 125 ml/hr  NUTRITION - FOCUSED PHYSICAL EXAM:  Unable to assess some areas given severe edema  Flowsheet Row Most Recent Value  Orbital Region No depletion  Upper Arm Region No depletion  Thoracic and Lumbar Region Unable to assess  Buccal Region No depletion  Temple Region No depletion  Clavicle Bone Region Unable to assess  Clavicle and Acromion Bone Region Unable to assess  Scapular Bone Region Unable to assess  Dorsal Hand Unable to assess  Patellar Region Unable to assess  Anterior Thigh Region Unable to assess  Posterior Calf Region Unable to assess  Edema (RD Assessment) Severe       Diet Order:   Diet Order             Diet NPO time specified  Diet effective now                   EDUCATION NEEDS:   Not appropriate for education at this time  Skin:  Skin Assessment: Reviewed RN Assessment  Last BM:  no documented BM  Height:   Ht Readings from Last 1 Encounters:  05/08/2022 5\' 8"  (1.727 m)    Weight:   Wt Readings from Last 1 Encounters:  04/18/22 128 kg     BMI:  Body mass index is 42.91 kg/m.  Estimated Nutritional Needs:   Kcal:  2100-2400 kcals  Protein:  140-175 g  Fluid:  1.8 L  06/19/22 MS, RDN, LDN, CNSC Registered  Dietitian 3 Clinical Nutrition RD Pager and On-Call Pager Number Located in [place]

## 2022-04-18 NOTE — Assessment & Plan Note (Signed)
No bleeding from site.   - Stop paralytic today.  - Removal sandbag tomorrow.

## 2022-04-18 NOTE — Progress Notes (Signed)
NAME:  James White, MRN:  [id number], DOB:  Jul 06, 1961, LOS: 1 ADMISSION DATE:  May 16, 2022, CONSULTATION DATE:  04/18/22 REFERRING MD:  Particia Nearing, CHIEF COMPLAINT:  syncope, HG   History of Present Illness:  James White is a 61 y.o. M with PMH of HTN and HL who presented to the ED with a syncopal episode while at work.  History taken from epic and patient was intubated.  He noted feeling poorly for several days prior and thought this was secondary to the head.  He went to work this morning and became diaphoretic and passed out.  On EMS arrival he was hypotensive and bradycardic as low as 29, EKG concerning for complete heart bloack.  He was intubated in the ED. Labs significant for trop of 99, creatinine 1.6, K 3.6, glucose >500 initially.  He was externally paced, then in intermittent atrial fib.  and seen by EP, plan for ICU admission and temp pacer with L and R heart cath.  PCCM consulted for ventilator management  Pertinent  Medical History   has a past medical history of High cholesterol and Hypertension.   Significant Hospital Events: Including procedures, antibiotic start and stop dates in addition to other pertinent events   7/10 Syncopal episode, bradycardia and complete HB, intubated, plan for temp pacer and cath lab, PCCM consult 7/10 DES to LAD, CX lesion. RCA patent.  7/10 brief arrest, temporary wire, VA ECMO via bifemoral cannulation, CP Impella to vent.  7/10 bleeding from left arterial cannulation site.  Bolsters placed by VVS  Interim History / Subjective:   Weaning pressors overnight. Received 1 unit PRBC. No further bleeding from the site.   Objective   Blood pressure (!) 76/68, pulse 80, temperature (!) 97.2 F (36.2 C), resp. rate (!) 28, height 5\' 8"  (1.727 m), weight 128 kg, SpO2 98 %. CVP:  [15 mmHg-21 mmHg] 21 mmHg  Vent Mode: PCV FiO2 (%):  [40 %-100 %] 40 % Set Rate:  [10 bmp-32 bmp] 28 bmp Vt Set:  [540 mL-650 mL] 650 mL PEEP:  [10 cmH20-14 cmH20] 10  cmH20 Plateau Pressure:  [25 cmH20-26 cmH20] 26 cmH20   Intake/Output Summary (Last 24 hours) at 04/18/2022 0844 Last data filed at 04/18/2022 0800 Gross per 24 hour  Intake 7593.06 ml  Output 1369 ml  Net 6224.06 ml   Filed Weights   05/16/22 0724 04/18/22 0500  Weight: 109.3 kg 128 kg    General:  critically ill-appearing M, intubated and sedated  HEENT: ++ facial swelling with scleral edema. ETT, OGT in place.  Neuro: Sedated BIS 42, on NMB.  CV: VA ECMO 3.7 Lpm , Impella P1, currently on E, NE with 06/19/22 of pulsatility. Extremities warm.  PULM:  PC 15/10 Vt 450 and Ppeak 26. Crackles a both bases.  GI: soft, obese, soft, no bowel sounds.  Extremities: ++ anasarca. Line sites intact. No bleeding from sites.  Skin: left flank is soft.   Ancillary tests personally reviewed:   ABG normal on current settings.  CO2 22 Lactate remains elevated 7.2 HB: 8.8 CXR: lines in place, evolving bilateral effusions.   Assessment & Plan:   Cardiogenic shock [facility]) Following STEMI. Now requiring ECMO support. Impella for LV venting. Improving LV function by echo.  -Continue ECMO support and monitor for LV recovery.  - Wean E/NE to keep MAP >70 and maintain 10 of pulsatility  Complete heart block (HCC) - TVPM set at 80 and requiring intermittent pacing with good capture.   CAD (  coronary artery disease) - Start lipitor 80 - ASA and Brillinta for stent.  Cardiac arrest (HCC)  Unable to assess due to sedation. BIS in 60's initially suggests some neurologic function.   - Wean sedation once hemostasis ensured.   Acute on chronic respiratory failure with hypoxia and hypercapnia (HCC) On VA ECMO support.  Reasonable Vt and MV on rest settings. Lung compliance improving.   - Maintain current ventilator settings. Oxygenation should improve with fluid removal.   Acute kidney injury (AKI) with acute tubular necrosis (ATN) (HCC) Minimal urine output.  Now on CRRT.  - Run fluid even  today. Initiation of ECMO will likely limit the ability to pull fluid initially.    Hemorrhage due to vascular catheter (HCC) No bleeding from site.   - Stop paralytic today.  - Removal sandbag tomorrow.   STEMI (ST elevation myocardial infarction) (HCC) S/P DES to LAD.  - Continue to monitor for LV recovery.   Best Practice (right click and "Reselect all SmartList Selections" daily)   Diet/type: NPO Start trickle feeds tomorrow.  DVT prophylaxis: Systemic bivalirudin GI prophylaxis: PPI Lines: Right TVPM, L IJ TLC, L [place] HD catheter, RF CP, RF drainage, LF return.  Foley:  Yes, and it is still needed Code Status:  full code Last date of multidisciplinary goals of care discussion [per primary]  CRITICAL CARE Performed by: Lynnell Catalan  Total critical care time: 50 minutes  Critical care time was exclusive of separately billable procedures and treating other patients.  Critical care was necessary to treat or prevent imminent or life-threatening deterioration.  Critical care was time spent personally by me on the following activities: development of treatment plan with patient and/or surrogate as well as nursing, discussions with consultants, evaluation of patient's response to treatment, examination of patient, obtaining history from patient or surrogate, ordering and performing treatments and interventions, ordering and review of laboratory studies, ordering and review of radiographic studies, pulse oximetry and re-evaluation of patient's condition.  Lynnell Catalan, MD [facility] ICU Physician [facility] [place] [facility]  Pager: [phone] Or Epic Secure Chat After hours: [phone].  04/18/2022, 9:28 AM

## 2022-04-18 NOTE — Assessment & Plan Note (Signed)
-   Start lipitor 80 - ASA and Brillinta for stent.

## 2022-04-18 NOTE — Progress Notes (Signed)
  Echocardiogram 2D Echocardiogram with contrast has been performed.  Roosvelt Maser F 04/18/2022, 1:23 PM

## 2022-04-18 NOTE — Progress Notes (Signed)
OT Cancellation Note  Patient Details Name: James White MRN: [id number] DOB: September 29, 1961   Cancelled Treatment:    Reason Eval/Treat Not Completed: Patient not medically ready, paralyzed on ECMO with bilateral femoral cannulas; discussed with Critical Care MD and RN. Will sign off, please reorder when medically appropriate for Occupational Therapy Evaluation.   Barry Brunner, OT Acute Rehabilitation Services Office [phone]   Chancy Milroy 04/18/2022, 8:10 AM

## 2022-04-18 NOTE — Progress Notes (Signed)
[facility]   Subjective  -weaned on pressors overnight.  Remains on [place] ECMO as well as Impella.  Intubated and critically ill.   Objective (!) 81/40 (!) 28 (!) 97.2 F (36.2 C) (!) 28 98%  Intake/Output Summary (Last 24 hours) at 04/18/2022 0705 Last data filed at 04/18/2022 0600 Gross per 24 hour  Intake 7287.07 ml  Output 1364 ml  Net 5923.07 ml    Left femoral artery cannula appears hemostatic with bolsters in the left groin.   There is some fullness in the left thigh but this has been stable overnight.  Laboratory Lab Results: Recent Labs    May 07, 2022 2106 2022/05/07 2107 04/18/22 0300 04/18/22 0307 04/18/22 0600  WBC 8.6  --  8.4  --   --   HGB 10.9*   < > 10.8* 10.2* 8.8*  HCT 32.6*   < > 31.4* 30.0* 26.0*  PLT 174  --  141*  --   --    < > = values in this interval not displayed.   BMET Recent Labs    04/18/22 0110 04/18/22 0116 04/18/22 0300 04/18/22 0307 04/18/22 0600  NA 144   < > 143 142 142  K 3.0*   < > 3.1* 3.1* 3.2*  CL 108  --  107  --   --   CO2 20*  --  21*  --   --   GLUCOSE 275*  --  216*  --   --   BUN 17  --  17  --   --   CREATININE 2.35*  --  2.44*  --   --   CALCIUM 7.2*  --  7.2*  --   --    < > = values in this interval not displayed.    COAG Lab Results  Component Value Date   INR 1.3 (H) 04/18/2022   INR 1.6 (H) 2022/05/07   INR 1.0 2022/05/07   No results found for: "PTT"  Assessment/Planning:  I was called for bleeding around the left groin arterial ECMO cannula into the femoral artery last night.  I discussed taking the patient to the operating room for removal of the cannula and repair of the femoral artery and in discussion with cardiology they did not feel the patient would survive having the arterial ECMO cannula removed in the OR.    We subsequently placed temporary bolsters at the bedside using large 1-0 silk sutures around the left femoral artery ECMO cannula with good hemostasis.     This looks okay this morning.  No active bleeding overnight.  We will continue to monitor.  Cephus Shelling 04/18/2022 7:05 AM --

## 2022-04-18 NOTE — Assessment & Plan Note (Signed)
Unable to assess due to sedation. BIS in 60's initially suggests some neurologic function.   - Wean sedation once hemostasis ensured.

## 2022-04-18 NOTE — Progress Notes (Signed)
PT Cancellation Note  Patient Details Name: James White MRN: [id number] DOB: October 12, 1960   Cancelled Treatment:    Reason Eval/Treat Not Completed: Patient not medically ready, paralyzed on ECMO with bilateral femoral cannulas; discussed with Critical Care MD and RN. Will sign off, please reorder when medically appropriate for Physical Therapy Evaluation.   Ina Homes, PT, DPT Acute Rehabilitation Services  Personal: Secure Chat Rehab Office: [phone]  Malachy Chamber 04/18/2022, 8:05 AM

## 2022-04-18 NOTE — Assessment & Plan Note (Signed)
-   TVPM set at 80 and requiring intermittent pacing with good capture.

## 2022-04-18 NOTE — Progress Notes (Addendum)
ANTICOAGULATION CONSULT NOTE  Pharmacy Consult for bivalirudin Indication:  ECMO + Impella  No Known Allergies  Patient Measurements: Height: 5\' 8"  (172.7 cm) Weight: 128 kg (282 lb 3 oz) IBW/kg (Calculated) : 68.4 Heparin Dosing Weight: 93kg  Vital Signs: Temp: 97.2 F (36.2 C) (07/11 0700) Temp Source: Core (07/11 0800) BP: 76/68 (07/11 0731) Pulse Rate: 80 (07/11 1104)  Labs: Recent Labs    04/23/2022 0730 05/06/2022 0934 04/26/2022 1020 04/16/2022 1741 05/02/2022 1812 04/18/2022 2106 05/06/2022 2107 04/27/2022 2301 04/30/2022 2302 04/18/22 0110 04/18/22 0116 04/18/22 0300 04/18/22 0305 04/18/22 0307 04/18/22 0600 04/18/22 1220  HGB 13.0  --    < > 9.8*   < > 10.9*   < >  --    < >  --    < > 10.8*  --  10.2* 8.8*  --   HCT 38.2*  --    < > 29.4*   < > 32.6*   < >  --    < >  --    < > 31.4*  --  30.0* 26.0*  --   PLT 276  --   --  162  --  174  --   --   --   --   --  141*  --   --   --   --   APTT  --   --    < > >200*  --  >200*  --  >200*  --   --   --  81*  --   --   --  55*  LABPROT  --  12.6  --  19.2*  --   --   --   --   --   --   --  15.7*  --   --   --   --   INR  --  1.0  --  1.6*  --   --   --   --   --   --   --  1.3*  --   --   --   --   HEPARINUNFRC  --   --   --  >1.10*  --   --   --  >1.10*  --   --   --   --  0.30  --   --   --   CREATININE 1.62*  --    < > DUPLICATE REQUEST  2.52*  --   --   --   --   --  2.35*  --  2.44*  --   --   --   --   TROPONINIHS 99* 1,198*  --   --   --  >24,000*  --   --   --   --   --   --   --   --   --   --    < > = values in this interval not displayed.     Estimated Creatinine Clearance: 41.5 mL/min (A) (by C-G formula based on SCr of 2.44 mg/dL (H)).   Medical History: Past Medical History:  Diagnosis Date   High cholesterol    Hypertension      Assessment: 45 yoM admitted with CHB and cardiogenic shock s/p Impella CP and ECMO cannulation. Pt is on bivalirudin for anticoagulation.  aPTT is slightly below goal at  55 seconds. Bleeding remains stable throughout the morning.  Goal of Therapy:  aPTT 60-80 seconds Monitor platelets by anticoagulation protocol: Yes  Plan:  Increase bivalirudin to 0.023 mg/kg/hr Check aPTT in 4h Continue bicarbonate in Impella purge  ADDENDUM 2010: Repeat aPTT remains below goal at 54 seconds. Increase bivalirudin to 0.028 mg/kg/h, recheck aPTT in 4h.  Fredonia Highland, PharmD, BCPS, [facility] Clinical Pharmacist [phone] Please check AMION for all [facility] Pharm[facility] numbers 04/18/2022

## 2022-04-18 NOTE — Assessment & Plan Note (Signed)
On VA ECMO support.  Reasonable Vt and MV on rest settings. Lung compliance improving.   - Maintain current ventilator settings. Oxygenation should improve with fluid removal.

## 2022-04-18 NOTE — Assessment & Plan Note (Signed)
S/P DES to LAD.  - Continue to monitor for LV recovery.

## 2022-04-18 NOTE — Progress Notes (Signed)
[facility] Progress Note   Subjective:   Remains critically ill on ecmo.  Family bedside.  Discussed with RN.   Objective Vitals:   04/18/22 0731 04/18/22 0800 04/18/22 1102 04/18/22 1104  BP: (!) 76/68     Pulse: 80 80  80  Resp: (!) 28   18  Temp:      TempSrc:  Core    SpO2: 98% 98% 99% 100%  Weight:      Height:       Physical Exam General: intubated, sedated.  Heart:  Lungs: coarse Abdomen: soft, mod distended Extremities: trace diffuse edema  Additional Objective Labs: Basic Metabolic Panel: Recent Labs  Lab 04/20/2022 1741 04/20/2022 1812 04/18/22 0110 04/18/22 0116 04/18/22 0300 04/18/22 0307 69/67/89 3810  NA DUPLICATE REQUEST  175   < > 144   < > 102 585 277  K DUPLICATE REQUEST  4.0   < > 3.0*   < > 3.1* 3.1* 3.2*  CL DUPLICATE REQUEST  824  --  108  --  107  --   --   CO2 DUPLICATE REQUEST  16*  --  20*  --  21*  --   --   GLUCOSE DUPLICATE REQUEST  235*  --  275*  --  216*  --   --   BUN DUPLICATE REQUEST  21  --  17  --  17  --   --   CREATININE DUPLICATE REQUEST  3.61*  --  2.35*  --  2.44*  --   --   CALCIUM DUPLICATE REQUEST  6.0*  --  7.2*  --  7.2*  --   --   PHOS 7.7*  --   --   --  2.9  --   --    < > = values in this interval not displayed.   Liver Function Tests: Recent Labs  Lab 04/12/2022 1741 04/18/22 0110 04/18/22 0300  AST 732* 1,402* 1,324*  ALT 362* 457* 428*  ALKPHOS 38 39 36*  BILITOT 0.7 0.6 0.3  PROT 3.2* 4.2* 4.2*  ALBUMIN 1.8*  1.8* 2.4* 2.5*   No results for input(s): "LIPASE", "AMYLASE" in the last 168 hours. CBC: Recent Labs  Lab 04/19/2022 0730 05/05/2022 1020 04/10/2022 1741 04/09/2022 1812 04/28/2022 2106 04/16/2022 2107 04/18/22 0300 04/18/22 0307 04/18/22 0600  WBC 10.2  --  9.3  --  8.6  --  8.4  --   --   NEUTROABS 5.4  --   --   --   --   --   --   --   --   HGB 13.0   < > 9.8*   < > 10.9*   < > 10.8* 10.2* 8.8*  HCT 38.2*   < > 29.4*   < > 32.6*   < > 31.4* 30.0* 26.0*  MCV 92.7  --   93.3  --  90.6  --  88.5  --   --   PLT 276  --  162  --  174  --  141*  --   --    < > = values in this interval not displayed.   Blood Culture No results found for: "SDES", "SPECREQUEST", "CULT", "REPTSTATUS"  Cardiac Enzymes: No results for input(s): "CKTOTAL", "CKMB", "CKMBINDEX", "TROPONINI" in the last 168 hours. CBG: Recent Labs  Lab 04/18/22 0457 04/18/22 0559 04/18/22 0658 04/18/22 0800 04/18/22 1003  GLUCAP 161* 183* 165* 159* 138*   Iron Studies: No results for input(s): "  IRON", "TIBC", "TRANSFERRIN", "FERRITIN" in the last 72 hours. @lablastinr3 @ Studies/Results: DG CHEST PORT 1 VIEW  Result Date: 04/18/2022 CLINICAL DATA:  Congestive heart failure EXAM: PORTABLE CHEST 1 VIEW COMPARISON:  Chest x-ray dated April 17, 2022 FINDINGS: Stable position of ETT, enteric tube, left IJ approach catheter, left subclavian approach catheter, and Impella device. Stable enlarged cardiac and mediastinal contours. Right-greater-than-left airspace opacities. Small bilateral pleural effusions. No evidence of pneumothorax. IMPRESSION: 1. Stable support devices. 2. Right-greater-than-left airspace opacities, unchanged when compared to prior and likely due to pulmonary edema. Electronically Signed   By: Yetta Glassman M.D.   On: 04/18/2022 08:13   DG Chest Port 1 View  Result Date: 04/23/2022 CLINICAL DATA:  Respiratory failure with intubation, lines placed, ECMO. EXAM: PORTABLE CHEST 1 VIEW COMPARISON:  Portable chest earlier today at 7:50 a.m. FINDINGS: 7:59 p.m. ETT has been inserted and the tip is 4.9 cm from the carina. NGT enters the stomach but the intragastric course is not filmed. Small caliber catheter through a right IJ introducer sheath is difficult to follow distally but I believe it terminates in the right ventricle. There is a left IJ approach catheter terminating at the brachiocephalic/SVC junction, left subclavian approach double-lumen catheter terminating in the upper SVC. On the  left there is overlying wiring attached to a partially translucent electrical pad superimposing over the base. Small cylindrical electronic device superimposing at the level of the carina and proximal ascending aorta is attached to 2 thin parallel wires roughly paralleling the course of the thoracic aorta and extending off of the film in the upper abdomen. The heart is enlarged. There is increased moderate vascular engorgement, perihilar alveolar opacities radiating outward on the right-greater-than-left, and increased moderate right and small left pleural effusions. No pneumothorax is seen. IMPRESSION: 1. Moderate vascular engorgement and perihilar edema on the right-greater-than-left have developed since the earlier film from today. Progress chest films recommended depending on clinical response. 2. Right-greater-than-left pleural effusions. 3. Support apparatus as detailed above. Electronically Signed   By: Telford Nab M.D.   On: 05/06/2022 20:20   CARDIAC CATHETERIZATION  Result Date: 05/01/2022 Images from the original result were not included.   Prox RCA lesion is 80% stenosed.   Ost Cx to Prox Cx lesion is 100% stenosed.   Mid LM to Prox LAD lesion is 80% stenosed.   Prox LAD lesion is 95% stenosed.   A stent was successfully placed using a SYNERGY XD [id number].   Post intervention, there is a 0% residual stenosis.   Post intervention, there is a 0% residual stenosis.   There is moderate to severe left ventricular systolic dysfunction.   LV end diastolic pressure is severely elevated.   The left ventricular ejection fraction is 35-45% by visual estimate. James White is a 61 y.o. male  [id number] LOCATION:  FACILITY: [facility] PHYSICIAN: Quay Burow, M.D. 05-17-1961 DATE OF PROCEDURE:  05/04/2022 DATE OF DISCHARGE: CARDIAC CATHETERIZATION Marjorie Smolder drug-eluting stent LAD History obtained from chart review.  61 year old African-American male with history of diabetes and hypertension who collapsed at work.  He is  proxy [facility] nursing room where he was found to be in complete heart block, and cardiogenic shock.  He is brought to the Cath Lab urgently for right left heart cath. PROCEDURE DESCRIPTION: The patient was brought to the second floor [facility] lab in the postabsorptive state. He was premedicated with IV Versed and fentanyl. His right groin was prepped and shaved in usual sterile  fashion. Xylocaine 1% was used for local anesthesia. A 5 French sheath was inserted into the right common femoral artery using standard Seldinger technique.  Ultrasound was used to identify the right common femoral artery and guide access.  A digital image was captured and placed the patient's chart.  A 7 French sheath was inserted into the right common femoral vein.  A 7 [place] balloontipped stimulation Swan-Ganz catheter was then advanced to the right heart chambers obtaining sequential pressures and pulmonary blood samples for the determination of cardiac output.  5 French right left second sinus and catheters were used for selective coronary angiography and left ventriculography speculate.  Isovue dye is used for the entirety of the case.  Retrograde aortic, ventricular and pullback pressures were recorded.  The LVEDP was measured at 38. The patient was persistently hypotensive and hypoxic.  The decision was made to place a Impella CP prior to intervention.  This was performed with the assistance of Dr. Gerald Stabs End who also placed a temporary transvenous pacemaker in the right IJ. The patient was placed on cangrelor drip and received heparin bolus followed by infusion with a therapeutic ACT.  I then stuck the Impella diaphragm with a micropuncture needle and placed a long 6 French sheath alongside the Impella.  I then used a 6 [place] XB LAD 3.0 cm guide cath along the 0.14 [place] guidewire a 2.5 mm x 5012 mm long balloon to predilate the entire left main and proximal LAD.  I then placed a 3.5 x 38 mm Synergy  drug-eluting stent across the distal left main into the proximal LAD and deployed at 14 atm Berry overlapped a 3.5 x 16 mm Synergy drug-eluting stent from the ostium of the left left main into the proximal mLAD and postdilated the entire stented segment with a 4 mm x 15 mm long noncompliant balloon resulting in reduction of a long 80 and focal 95% hypodense lesion to 0% residual TIMI-3 flow. HEMODYNAMICS:  1: LVEDP-38 2: Right atrial pressure-35/33 3: RV pressure-70/17 4: Pulmonary pressure-70/20 5: Pulmonary wedge pressure-A-wave 56, V wave 54, mean 48 6: Cardiac output-3.2 L/min with an index of 1.45 L/min per metered squared   Mr. Larrick had cardiogenic shock probably related to an occluded circumflex.  We could never find the origin.  He had a small RCA with 80% segmental proximal stenosis.  He had successful stenting of his left main and distal LAD.  Because of inability to oxygenate despite Impella he was placed on VA ECMO.  Dr. Aundra Dubin will dictate the ECMO note.  He was placed on cangrelor, IV heparin as well and will be converted to p.o. Brilinta once an OG tube was placed and position was verified. Quay Burow. MD, [facility] 04/28/2022 2:52 PM    ECHOCARDIOGRAM LIMITED  Result Date: 05/01/2022    ECHOCARDIOGRAM LIMITED REPORT   Patient Name:   James White Date of Exam: 04/26/2022 Medical Rec #:  [id number]     Height:       68.0 in Accession #:    [id number]    Weight:       241.0 lb Date of Birth:  29-Oct-1960     BSA:          2.212 m Patient Age:    38 years      BP:           68/52 mmHg Patient Gender: M             HR:  70 bpm. Exam Location:  Inpatient Procedure: Limited Echo and Intracardiac Opacification Agent STAT ECHO Indications:    Cardiac Arrest i46.9  History:        Patient has no prior history of Echocardiogram examinations.                 Risk Factors:Hypertension, Diabetes and Dyslipidemia.  Sonographer:    Maudry Mayhew RDMS, RVT, RDCS Referring Phys: Ina Homes MD   Sonographer Comments: Stat echo performed in cath lab during impella and ecmo cannulation IMPRESSIONS  1. Left ventricular ejection fraction, by estimation, is 40 to 45%. The left ventricle has mildly decreased function. Normal apical function, hypokinesis in basal segments  2. Right ventricular systolic function is mildly reduced. The right ventricular size is normal.  3. Impella measures 4.4cm into LV from aortic valve FINDINGS  Left Ventricle: Left ventricular ejection fraction, by estimation, is 40 to 45%. The left ventricle has mildly decreased function. The left ventricle demonstrates regional wall motion abnormalities. Definity contrast agent was given IV to delineate the left ventricular endocardial borders. Right Ventricle: The right ventricular size is normal. Right ventricular systolic function is mildly reduced. Oswaldo Milian MD Electronically signed by Oswaldo Milian MD Signature Date/Time: 04/16/2022/4:44:35 PM    Final    DG Chest Port 1 View  Result Date: 04/20/2022 CLINICAL DATA:  Pt to ED from work c/o witnessed syncopal episode. Diaphoretic on arrival, cbg 286. Hypotensive at 76/57. Bradycardia. EXAM: PORTABLE CHEST 1 VIEW COMPARISON:  None Available. FINDINGS: Cardiac silhouette is normal in size. No mediastinal or hilar masses. Clear lungs. No pleural effusion or gross pneumothorax on this supine exam. Skeletal structures are grossly intact IMPRESSION: No active disease. Electronically Signed   By: Lajean Manes M.D.   On: 04/12/2022 07:58   Medications:  sodium chloride 10 mL/hr at 04/18/22 1000   sodium chloride     sodium chloride     sodium chloride Stopped (04/09/2022 1846)   albumin human Stopped (04/18/22 0517)   amiodarone 30 mg/hr (04/18/22 1130)   bivalirudin (ANGIOMAX) 250 mg in sodium chloride 0.9 % 500 mL (0.5 mg/mL) infusion 0.02 mg/kg/hr (04/18/22 1000)   dextrose 5 % and 0.45% NaCl     epinephrine 8 mcg/min (04/18/22 1000)   fentaNYL infusion INTRAVENOUS  100 mcg/hr (04/18/22 1000)   insulin 12 Units/hr (04/18/22 1000)   meropenem (MERREM) IV Stopped (04/18/22 0603)   midazolam 8 mg/hr (04/18/22 1000)   norepinephrine (LEVOPHED) Adult infusion 11 mcg/min (04/18/22 1000)   prismasol BGK 4/2.5 1,500 mL/hr at 04/18/22 1000   sodium bicarbonate 150 mEq in sterile water 1,150 mL infusion Stopped (04/18/22 0934)   sodium bicarbonate 150 mEq in sterile water 1,150 mL infusion 125 mL/hr at 04/18/22 0937   sodium bicarbonate 150 mEq in sterile water 1,150 mL infusion 125 mL/hr at 04/18/22 0936   sodium bicarbonate 25 mEq (Impella PURGE) in dextrose 5 % 1000 mL bag     vasopressin Stopped (04/18/22 0411)    amiodarone  200 mg Per Tube BID   artificial tears  1 Application Both Eyes G2R   aspirin  81 mg Per Tube Daily   atorvastatin  80 mg Per Tube Daily   Chlorhexidine Gluconate Cloth  6 each Topical Daily   fentaNYL (SUBLIMAZE) injection  50 mcg Intravenous Once   ipratropium-albuterol  3 mL Nebulization Q4H   mouth rinse  15 mL Mouth Rinse Q2H   pantoprazole (PROTONIX) IV  40 mg Intravenous QHS   sodium chloride  flush  10-40 mL Intracatheter Q12H   sodium chloride flush  3 mL Intravenous Q12H   sodium chloride flush  3 mL Intravenous Q12H   ticagrelor  90 mg Per Tube BID    Assessment/Plan:   # AKI in the setting of cardiogenic shock - CRRT orders are in place  - Tolerating UFR 180/hr currently, increase to net neg UF as hemodynamics allow - net + 6.8L for admission.   #hypokalemia: replete IV or enterally. On 4K dialysate.  If acidosis allows will convert pre filter to 4K as well from bicarb gtt.    # PEA arrest - post-arrest supportive care per cardiology and critical care   # Complete heart block  - s/p temp pacer   # Cardiogenic shock  - pressors/inotropes per critical care    # Anemia - acute blood loss  - s/p PRBC's and had bled out from the site of ECMO cannulation.  - PRBC's per primary team    # Acute MI - s/p PCI  with cardiology on 7/10   # Acute hypoxic respiratory failure - on vent per critical care and on ecmo   # Metabolic acidosis -converted bicarb gtt to pre/post filter fluids to day to help with volume   # Hypocalcemia - ordered repletion  - continue to monitor   Jannifer Hick MD 04/18/2022, 12:57 PM  [facility] Pager: [phone]

## 2022-04-19 ENCOUNTER — Inpatient Hospital Stay (HOSPITAL_COMMUNITY): Payer: Medicaid Other

## 2022-04-19 DIAGNOSIS — J9621 Acute and chronic respiratory failure with hypoxia: Secondary | ICD-10-CM

## 2022-04-19 DIAGNOSIS — J9622 Acute and chronic respiratory failure with hypercapnia: Secondary | ICD-10-CM

## 2022-04-19 LAB — COOXEMETRY PANEL
Carboxyhemoglobin: 1.1 % (ref 0.5–1.5)
Methemoglobin: <0.7 % (ref 0.0–1.5)
O2 Saturation: 51.2 %
Total hemoglobin: 8.5 g/dL — ABNORMAL LOW (ref 12.0–16.0)

## 2022-04-19 LAB — POCT I-STAT 7, (LYTES, BLD GAS, ICA,H+H)
Acid-Base Excess: 1 mmol/L (ref 0.0–2.0)
Acid-Base Excess: 1 mmol/L (ref 0.0–2.0)
Acid-Base Excess: 2 mmol/L (ref 0.0–2.0)
Acid-Base Excess: 3 mmol/L — ABNORMAL HIGH (ref 0.0–2.0)
Acid-Base Excess: 3 mmol/L — ABNORMAL HIGH (ref 0.0–2.0)
Acid-Base Excess: 3 mmol/L — ABNORMAL HIGH (ref 0.0–2.0)
Acid-Base Excess: 3 mmol/L — ABNORMAL HIGH (ref 0.0–2.0)
Acid-Base Excess: 4 mmol/L — ABNORMAL HIGH (ref 0.0–2.0)
Bicarbonate: 25 mmol/L (ref 20.0–28.0)
Bicarbonate: 25.3 mmol/L (ref 20.0–28.0)
Bicarbonate: 25.5 mmol/L (ref 20.0–28.0)
Bicarbonate: 26.2 mmol/L (ref 20.0–28.0)
Bicarbonate: 26.2 mmol/L (ref 20.0–28.0)
Bicarbonate: 26.3 mmol/L (ref 20.0–28.0)
Bicarbonate: 27.9 mmol/L (ref 20.0–28.0)
Bicarbonate: 28.1 mmol/L — ABNORMAL HIGH (ref 20.0–28.0)
Calcium, Ion: 0.96 mmol/L — ABNORMAL LOW (ref 1.15–1.40)
Calcium, Ion: 0.96 mmol/L — ABNORMAL LOW (ref 1.15–1.40)
Calcium, Ion: 1.01 mmol/L — ABNORMAL LOW (ref 1.15–1.40)
Calcium, Ion: 1.01 mmol/L — ABNORMAL LOW (ref 1.15–1.40)
Calcium, Ion: 1.01 mmol/L — ABNORMAL LOW (ref 1.15–1.40)
Calcium, Ion: 1.02 mmol/L — ABNORMAL LOW (ref 1.15–1.40)
Calcium, Ion: 1.04 mmol/L — ABNORMAL LOW (ref 1.15–1.40)
Calcium, Ion: 1.04 mmol/L — ABNORMAL LOW (ref 1.15–1.40)
HCT: 22 % — ABNORMAL LOW (ref 39.0–52.0)
HCT: 24 % — ABNORMAL LOW (ref 39.0–52.0)
HCT: 24 % — ABNORMAL LOW (ref 39.0–52.0)
HCT: 24 % — ABNORMAL LOW (ref 39.0–52.0)
HCT: 24 % — ABNORMAL LOW (ref 39.0–52.0)
HCT: 26 % — ABNORMAL LOW (ref 39.0–52.0)
HCT: 26 % — ABNORMAL LOW (ref 39.0–52.0)
HCT: 26 % — ABNORMAL LOW (ref 39.0–52.0)
Hemoglobin: 7.5 g/dL — ABNORMAL LOW (ref 13.0–17.0)
Hemoglobin: 8.2 g/dL — ABNORMAL LOW (ref 13.0–17.0)
Hemoglobin: 8.2 g/dL — ABNORMAL LOW (ref 13.0–17.0)
Hemoglobin: 8.2 g/dL — ABNORMAL LOW (ref 13.0–17.0)
Hemoglobin: 8.2 g/dL — ABNORMAL LOW (ref 13.0–17.0)
Hemoglobin: 8.8 g/dL — ABNORMAL LOW (ref 13.0–17.0)
Hemoglobin: 8.8 g/dL — ABNORMAL LOW (ref 13.0–17.0)
Hemoglobin: 8.8 g/dL — ABNORMAL LOW (ref 13.0–17.0)
O2 Saturation: 100 %
O2 Saturation: 100 %
O2 Saturation: 100 %
O2 Saturation: 100 %
O2 Saturation: 97 %
O2 Saturation: 99 %
O2 Saturation: 99 %
O2 Saturation: 99 %
Patient temperature: 36.2
Patient temperature: 36.3
Patient temperature: 36.3
Patient temperature: 36.3
Patient temperature: 36.3
Patient temperature: 36.3
Patient temperature: 36.4
Patient temperature: 36.4
Potassium: 4.9 mmol/L (ref 3.5–5.1)
Potassium: 5.4 mmol/L — ABNORMAL HIGH (ref 3.5–5.1)
Potassium: 5.5 mmol/L — ABNORMAL HIGH (ref 3.5–5.1)
Potassium: 5.5 mmol/L — ABNORMAL HIGH (ref 3.5–5.1)
Potassium: 5.7 mmol/L — ABNORMAL HIGH (ref 3.5–5.1)
Potassium: 5.7 mmol/L — ABNORMAL HIGH (ref 3.5–5.1)
Potassium: 5.9 mmol/L — ABNORMAL HIGH (ref 3.5–5.1)
Potassium: 6.1 mmol/L — ABNORMAL HIGH (ref 3.5–5.1)
Sodium: 135 mmol/L (ref 135–145)
Sodium: 136 mmol/L (ref 135–145)
Sodium: 136 mmol/L (ref 135–145)
Sodium: 136 mmol/L (ref 135–145)
Sodium: 136 mmol/L (ref 135–145)
Sodium: 137 mmol/L (ref 135–145)
Sodium: 137 mmol/L (ref 135–145)
Sodium: 137 mmol/L (ref 135–145)
TCO2: 26 mmol/L (ref 22–32)
TCO2: 26 mmol/L (ref 22–32)
TCO2: 26 mmol/L (ref 22–32)
TCO2: 27 mmol/L (ref 22–32)
TCO2: 27 mmol/L (ref 22–32)
TCO2: 27 mmol/L (ref 22–32)
TCO2: 29 mmol/L (ref 22–32)
TCO2: 29 mmol/L (ref 22–32)
pCO2 arterial: 30.8 mmHg — ABNORMAL LOW (ref 32–48)
pCO2 arterial: 31.8 mmHg — ABNORMAL LOW (ref 32–48)
pCO2 arterial: 32.2 mmHg (ref 32–48)
pCO2 arterial: 34.8 mmHg (ref 32–48)
pCO2 arterial: 35 mmHg (ref 32–48)
pCO2 arterial: 35 mmHg (ref 32–48)
pCO2 arterial: 40 mmHg (ref 32–48)
pCO2 arterial: 41 mmHg (ref 32–48)
pH, Arterial: 7.438 (ref 7.35–7.45)
pH, Arterial: 7.452 — ABNORMAL HIGH (ref 7.35–7.45)
pH, Arterial: 7.459 — ABNORMAL HIGH (ref 7.35–7.45)
pH, Arterial: 7.466 — ABNORMAL HIGH (ref 7.35–7.45)
pH, Arterial: 7.479 — ABNORMAL HIGH (ref 7.35–7.45)
pH, Arterial: 7.516 — ABNORMAL HIGH (ref 7.35–7.45)
pH, Arterial: 7.522 — ABNORMAL HIGH (ref 7.35–7.45)
pH, Arterial: 7.524 — ABNORMAL HIGH (ref 7.35–7.45)
pO2, Arterial: 104 mmHg (ref 83–108)
pO2, Arterial: 135 mmHg — ABNORMAL HIGH (ref 83–108)
pO2, Arterial: 141 mmHg — ABNORMAL HIGH (ref 83–108)
pO2, Arterial: 156 mmHg — ABNORMAL HIGH (ref 83–108)
pO2, Arterial: 175 mmHg — ABNORMAL HIGH (ref 83–108)
pO2, Arterial: 184 mmHg — ABNORMAL HIGH (ref 83–108)
pO2, Arterial: 293 mmHg — ABNORMAL HIGH (ref 83–108)
pO2, Arterial: 75 mmHg — ABNORMAL LOW (ref 83–108)

## 2022-04-19 LAB — CBC
HCT: 25.1 % — ABNORMAL LOW (ref 39.0–52.0)
HCT: 24.8 % — ABNORMAL LOW (ref 39.0–52.0)
HCT: 24.7 % — ABNORMAL LOW (ref 39.0–52.0)
Hemoglobin: 8.7 g/dL — ABNORMAL LOW (ref 13.0–17.0)
Hemoglobin: 8.3 g/dL — ABNORMAL LOW (ref 13.0–17.0)
Hemoglobin: 8.3 g/dL — ABNORMAL LOW (ref 13.0–17.0)
MCH: 30.2 pg (ref 26.0–34.0)
MCH: 29.9 pg (ref 26.0–34.0)
MCH: 30.1 pg (ref 26.0–34.0)
MCHC: 34.7 g/dL (ref 30.0–36.0)
MCHC: 33.5 g/dL (ref 30.0–36.0)
MCHC: 33.6 g/dL (ref 30.0–36.0)
MCV: 87.2 fL (ref 80.0–100.0)
MCV: 89.2 fL (ref 80.0–100.0)
MCV: 89.5 fL (ref 80.0–100.0)
Platelets: 96 10*3/uL — ABNORMAL LOW (ref 150–400)
Platelets: 96 10*3/uL — ABNORMAL LOW (ref 150–400)
Platelets: 91 10*3/uL — ABNORMAL LOW (ref 150–400)
RBC: 2.88 MIL/uL — ABNORMAL LOW (ref 4.22–5.81)
RBC: 2.78 MIL/uL — ABNORMAL LOW (ref 4.22–5.81)
RBC: 2.76 MIL/uL — ABNORMAL LOW (ref 4.22–5.81)
RDW: 16 % — ABNORMAL HIGH (ref 11.5–15.5)
RDW: 16.5 % — ABNORMAL HIGH (ref 11.5–15.5)
RDW: 16.4 % — ABNORMAL HIGH (ref 11.5–15.5)
WBC: 10.5 10*3/uL (ref 4.0–10.5)
WBC: 9.5 10*3/uL (ref 4.0–10.5)
WBC: 10.1 10*3/uL (ref 4.0–10.5)
nRBC: 0 % (ref 0.0–0.2)
nRBC: 0 % (ref 0.0–0.2)
nRBC: 0 % (ref 0.0–0.2)

## 2022-04-19 LAB — COMPREHENSIVE METABOLIC PANEL
ALT: 328 U/L — ABNORMAL HIGH (ref 0–44)
AST: 900 U/L — ABNORMAL HIGH (ref 15–41)
Albumin: 2.6 g/dL — ABNORMAL LOW (ref 3.5–5.0)
Alkaline Phosphatase: 40 U/L (ref 38–126)
Anion gap: 9 (ref 5–15)
BUN: 12 mg/dL (ref 8–23)
CO2: 23 mmol/L (ref 22–32)
Calcium: 6.7 mg/dL — ABNORMAL LOW (ref 8.9–10.3)
Chloride: 104 mmol/L (ref 98–111)
Creatinine, Ser: 2.42 mg/dL — ABNORMAL HIGH (ref 0.61–1.24)
GFR, Estimated: 30 mL/min — ABNORMAL LOW (ref >60)
Glucose, Bld: 175 mg/dL — ABNORMAL HIGH (ref 70–99)
Potassium: 6.2 mmol/L — ABNORMAL HIGH (ref 3.5–5.1)
Sodium: 136 mmol/L (ref 135–145)
Total Bilirubin: 0.4 mg/dL (ref 0.3–1.2)
Total Protein: 4.4 g/dL — ABNORMAL LOW (ref 6.5–8.1)

## 2022-04-19 LAB — RENAL FUNCTION PANEL
Albumin: 2.7 g/dL — ABNORMAL LOW (ref 3.5–5.0)
Albumin: 2.7 g/dL — ABNORMAL LOW (ref 3.5–5.0)
Anion gap: 6 (ref 5–15)
Anion gap: 8 (ref 5–15)
BUN: 14 mg/dL (ref 8–23)
BUN: 13 mg/dL (ref 8–23)
CO2: 23 mmol/L (ref 22–32)
CO2: 24 mmol/L (ref 22–32)
Calcium: 7 mg/dL — ABNORMAL LOW (ref 8.9–10.3)
Calcium: 7.2 mg/dL — ABNORMAL LOW (ref 8.9–10.3)
Chloride: 105 mmol/L (ref 98–111)
Chloride: 105 mmol/L (ref 98–111)
Creatinine, Ser: 2.32 mg/dL — ABNORMAL HIGH (ref 0.61–1.24)
Creatinine, Ser: 2.42 mg/dL — ABNORMAL HIGH (ref 0.61–1.24)
GFR, Estimated: 31 mL/min — ABNORMAL LOW (ref >60)
GFR, Estimated: 30 mL/min — ABNORMAL LOW (ref >60)
Glucose, Bld: 169 mg/dL — ABNORMAL HIGH (ref 70–99)
Glucose, Bld: 164 mg/dL — ABNORMAL HIGH (ref 70–99)
Phosphorus: 3.8 mg/dL (ref 2.5–4.6)
Phosphorus: 4.4 mg/dL (ref 2.5–4.6)
Potassium: 5.7 mmol/L — ABNORMAL HIGH (ref 3.5–5.1)
Potassium: 5.4 mmol/L — ABNORMAL HIGH (ref 3.5–5.1)
Sodium: 134 mmol/L — ABNORMAL LOW (ref 135–145)
Sodium: 137 mmol/L (ref 135–145)

## 2022-04-19 LAB — BASIC METABOLIC PANEL
Anion gap: 9 (ref 5–15)
BUN: 13 mg/dL (ref 8–23)
CO2: 24 mmol/L (ref 22–32)
Calcium: 7.2 mg/dL — ABNORMAL LOW (ref 8.9–10.3)
Chloride: 103 mmol/L (ref 98–111)
Creatinine, Ser: 2.49 mg/dL — ABNORMAL HIGH (ref 0.61–1.24)
GFR, Estimated: 29 mL/min — ABNORMAL LOW (ref >60)
Glucose, Bld: 162 mg/dL — ABNORMAL HIGH (ref 70–99)
Potassium: 5.4 mmol/L — ABNORMAL HIGH (ref 3.5–5.1)
Sodium: 136 mmol/L (ref 135–145)

## 2022-04-19 LAB — POTASSIUM: Potassium: 5.9 mmol/L — ABNORMAL HIGH (ref 3.5–5.1)

## 2022-04-19 LAB — GLUCOSE, CAPILLARY
Glucose-Capillary: 142 mg/dL — ABNORMAL HIGH (ref 70–99)
Glucose-Capillary: 158 mg/dL — ABNORMAL HIGH (ref 70–99)
Glucose-Capillary: 158 mg/dL — ABNORMAL HIGH (ref 70–99)
Glucose-Capillary: 171 mg/dL — ABNORMAL HIGH (ref 70–99)
Glucose-Capillary: 174 mg/dL — ABNORMAL HIGH (ref 70–99)
Glucose-Capillary: 176 mg/dL — ABNORMAL HIGH (ref 70–99)

## 2022-04-19 LAB — PHOSPHORUS: Phosphorus: 3.4 mg/dL (ref 2.5–4.6)

## 2022-04-19 LAB — PROTIME-INR
INR: 1.4 — ABNORMAL HIGH (ref 0.8–1.2)
Prothrombin Time: 16.7 seconds — ABNORMAL HIGH (ref 11.4–15.2)

## 2022-04-19 LAB — APTT
aPTT: 57 seconds — ABNORMAL HIGH (ref 24–36)
aPTT: 59 seconds — ABNORMAL HIGH (ref 24–36)
aPTT: 60 seconds — ABNORMAL HIGH (ref 24–36)

## 2022-04-19 LAB — MAGNESIUM: Magnesium: 2.6 mg/dL — ABNORMAL HIGH (ref 1.7–2.4)

## 2022-04-19 LAB — LACTIC ACID, PLASMA
Lactic Acid, Venous: 3.3 mmol/L (ref 0.5–1.9)
Lactic Acid, Venous: 3.5 mmol/L (ref 0.5–1.9)
Lactic Acid, Venous: 2.8 mmol/L (ref 0.5–1.9)

## 2022-04-19 LAB — LIPOPROTEIN A (LPA): Lipoprotein (a): 86.1 nmol/L — ABNORMAL HIGH (ref <75.0)

## 2022-04-19 LAB — LACTATE DEHYDROGENASE: LDH: 1902 U/L — ABNORMAL HIGH (ref 98–192)

## 2022-04-19 LAB — FIBRINOGEN: Fibrinogen: 498 mg/dL — ABNORMAL HIGH (ref 210–475)

## 2022-04-19 MED ORDER — PRISMASOL BGK 0/2.5 32-2.5 MEQ/L EC SOLN
Status: DC
  Filled 2022-04-19 (×36): qty 5000

## 2022-04-19 MED ORDER — VITAL HIGH PROTEIN PO LIQD: 1000.0000 mL | ORAL | Status: DC

## 2022-04-19 MED ORDER — CALCIUM GLUCONATE-NACL 2-0.675 GM/100ML-% IV SOLN
2.0000 g | Freq: Once | INTRAVENOUS | Status: AC
  Administered 2022-04-19: 2000 mg via INTRAVENOUS
  Filled 2022-04-19: qty 100

## 2022-04-19 MED ORDER — PRISMASOL BGK 4/2.5 32-4-2.5 MEQ/L REPLACEMENT SOLN: Status: DC

## 2022-04-19 MED ORDER — RENA-VITE PO TABS
1.0000 | ORAL_TABLET | Freq: Every day | ORAL | Status: DC
  Administered 2022-04-19 – 2022-04-20 (×2): 1
  Filled 2022-04-19 (×2): qty 1

## 2022-04-19 MED ORDER — ALBUMIN HUMAN 25 % IV SOLN
INTRAVENOUS | Status: AC
  Filled 2022-04-19: qty 50

## 2022-04-19 MED ORDER — ALBUMIN HUMAN 25 % IV SOLN
12.5000 g | Freq: Once | INTRAVENOUS | Status: AC
  Administered 2022-04-19: 12.5 g via INTRAVENOUS

## 2022-04-19 MED ORDER — CISATRACURIUM BESYLATE 20 MG/10ML IV SOLN
10.0000 mg | Freq: Once | INTRAVENOUS | Status: AC
  Administered 2022-04-19: 10 mg via INTRAVENOUS
  Filled 2022-04-19: qty 10

## 2022-04-19 MED ORDER — MIDODRINE HCL 5 MG PO TABS
5.0000 mg | ORAL_TABLET | Freq: Three times a day (TID) | ORAL | Status: DC
  Administered 2022-04-19 – 2022-04-20 (×4): 5 mg
  Filled 2022-04-19 (×4): qty 1

## 2022-04-19 MED ORDER — PIVOT 1.5 CAL PO LIQD
1000.0000 mL | ORAL | Status: DC
  Administered 2022-04-19: 1000 mL

## 2022-04-19 NOTE — Progress Notes (Signed)
Palliative:  Consult received and chart reviewed. Check in with Dr. Denese Killings - asked to hold off on consult for now.   Please reach out to Palliative team via epic chart or at [phone] if our involvement is warranted.  Gerlean Ren, DNP, AGNP-C Palliative Medicine Team Team Phone # [phone]  Pager # [phone]  NO CHARGE

## 2022-04-19 NOTE — Progress Notes (Signed)
ANTICOAGULATION CONSULT NOTE  Pharmacy Consult for bivalirudin Indication:  ECMO + Impella  No Known Allergies  Patient Measurements: Height: 5\' 8"  (172.7 cm) Weight: 127.8 kg (281 lb 12 oz) IBW/kg (Calculated) : 68.4 Heparin Dosing Weight: 93kg  Vital Signs: Temp: 97.2 F (36.2 C) (07/12 0700) Temp Source: Bladder (07/12 0400) BP: 78/61 (07/12 0726) Pulse Rate: 80 (07/12 0726)  Labs: Recent Labs     0000 05/02/2022 0730 05/04/2022 0934 04/08/2022 1020 04/26/2022 1741 04/29/2022 1812 04/18/2022 2106 04/24/2022 2107 04/16/2022 2301 05/06/2022 2302 04/18/22 0300 04/18/22 0305 04/18/22 0307 04/18/22 1521 04/18/22 1643 04/18/22 1744 04/18/22 2008 04/19/22 0015 04/19/22 0017 04/19/22 0300 04/19/22 0306 04/19/22 0530 04/19/22 0805  HGB  --  13.0  --    < > 9.8*   < > 10.9*   < >  --    < > 10.8*  --    < > 9.4*  8.8*   < >  --    < >  --    < > 8.7* 8.2* 8.2* 8.8*  HCT  --  38.2*  --    < > 29.4*   < > 32.6*   < >  --    < > 31.4*  --    < > 26.8*  26.0*   < >  --    < >  --    < > 25.1* 24.0* 24.0* 26.0*  PLT  --  276  --   --  162  --  174  --   --   --  141*  --   --  115*  --   --   --   --   --  96*  --   --   --   APTT  --   --   --    < > >200*  --  >200*  --  >200*  --  81*  --    < >  --   --  54*  --  59*  --  57*  --   --   --   LABPROT   < >  --  12.6  --  19.2*  --   --   --   --   --  15.7*  --   --   --   --   --   --   --   --  16.7*  --   --   --   INR   < >  --  1.0  --  1.6*  --   --   --   --   --  1.3*  --   --   --   --   --   --   --   --  1.4*  --   --   --   HEPARINUNFRC  --   --   --   --  >1.10*  --   --   --  >1.10*  --   --  0.30  --   --   --   --   --   --   --   --   --   --   --   CREATININE  --  1.62*  --    < > DUPLICATE REQUEST  2.52*  --   --   --   --    < > 2.44*  --   --  2.38*  --   --   --   --   --  2.42*  --   --   --   TROPONINIHS  --  99* 1,198*  --   --   --  >24,000*  --   --   --   --   --   --   --   --   --   --   --   --   --   --    --   --    < > = values in this interval not displayed.     Estimated Creatinine Clearance: 41.8 mL/min (A) (by C-G formula based on SCr of 2.42 mg/dL (H)).   Medical History: Past Medical History:  Diagnosis Date   High cholesterol    Hypertension      Assessment: 32 yoM admitted with CHB and cardiogenic shock s/p Impella CP and ECMO cannulation. Pt is on bivalirudin for anticoagulation. Will target aPTT 60-80 seconds with Impella placed as well.  aPTT is slightly below goal at 55 seconds. Bleeding remains stable today. CBC stable, pltc trending down.  Goal of Therapy:  aPTT 60-80 seconds Monitor platelets by anticoagulation protocol: Yes   Plan:  Increase bivalirudin to 0.03 mg/kg/hr Check q12h aPTT - 5a/5p  Fredonia Highland, PharmD, BCPS, [facility] Clinical Pharmacist [phone] Please check AMION for all [facility] Pharmacy numbers 04/19/2022

## 2022-04-19 NOTE — Progress Notes (Signed)
ANTICOAGULATION CONSULT NOTE  Pharmacy Consult for bivalirudin Indication:  ECMO + Impella  No Known Allergies  Patient Measurements: Height: 5\' 8"  (172.7 cm) Weight: 127.8 kg (281 lb 12 oz) IBW/kg (Calculated) : 68.4 Heparin Dosing Weight: 93kg  Vital Signs: Temp: 97.3 F (36.3 C) (07/12 1545) Temp Source: Bladder (07/12 1200) BP: 91/72 (07/12 1538) Pulse Rate: 80 (07/12 1538)  Labs: Recent Labs     0000 04/24/2022 0730 04/28/2022 0934 04/16/2022 1020 04/15/2022 1741 04/19/2022 1812 05/05/2022 2106 04/24/2022 2107 04/10/2022 2301 05/02/2022 2302 04/18/22 0300 04/18/22 0305 04/18/22 0307 04/18/22 1521 04/18/22 1643 04/19/22 0015 04/19/22 0017 04/19/22 0300 04/19/22 0306 04/19/22 1123 04/19/22 1125 04/19/22 1609 04/19/22 1617  HGB  --  13.0  --    < > 9.8*   < > 10.9*   < >  --    < > 10.8*  --    < > 9.4*  8.8*   < >  --    < > 8.7*   < > 8.3* 8.8* 8.3* 8.8*  HCT  --  38.2*  --    < > 29.4*   < > 32.6*   < >  --    < > 31.4*  --    < > 26.8*  26.0*   < >  --    < > 25.1*   < > 24.8* 26.0* 24.7* 26.0*  PLT  --  276  --   --  162  --  174  --   --   --  141*  --   --  115*  --   --   --  96*  --  96*  --  91*  --   APTT  --   --   --    < > >200*  --  >200*  --  >200*  --  81*  --    < >  --    < > 59*  --  57*  --   --   --  60*  --   LABPROT   < >  --  12.6  --  19.2*  --   --   --   --   --  15.7*  --   --   --   --   --   --  16.7*  --   --   --   --   --   INR   < >  --  1.0  --  1.6*  --   --   --   --   --  1.3*  --   --   --   --   --   --  1.4*  --   --   --   --   --   HEPARINUNFRC  --   --   --   --  >1.10*  --   --   --  >1.10*  --   --  0.30  --   --   --   --   --   --   --   --   --   --   --   CREATININE  --  1.62*  --    < > DUPLICATE REQUEST  2.52*  --   --   --   --    < > 2.44*  --   --  2.38*  --   --   --  2.42*  --  2.32*  --   --   --   TROPONINIHS  --  99* 1,198*  --   --   --  >24,000*  --   --   --   --   --   --   --   --   --   --   --   --   --   --    --   --    < > = values in this interval not displayed.     Estimated Creatinine Clearance: 43.6 mL/min (A) (by C-G formula based on SCr of 2.32 mg/dL (H)).   Medical History: Past Medical History:  Diagnosis Date   High cholesterol    Hypertension      Assessment: 80 yoM admitted with CHB and cardiogenic shock s/p Impella CP and ECMO cannulation. Pt is on bivalirudin for anticoagulation. Will target aPTT 60-80 seconds with Impella placed as well.  aPTT is therapeutic at 60, on bivalirudin 0.03 mg/kg/hr. Hgb stable at 8.8, plt 91. Bleeding stable. No infusion or clotting issues with circuit.   Goal of Therapy:  aPTT 60-80 seconds Monitor platelets by anticoagulation protocol: Yes   Plan:  Continue bivalirudin at 0.03 mg/kg/hr Check q12h aPTT - 5a/5p  Sherron Monday, PharmD, BCCCP Clinical Pharmacist  Phone: [phone] 04/19/2022 4:58 PM  Please check AMION for all [facility] Pharmacy phone numbers After 10:00 PM, call Main Pharmacy [phone]

## 2022-04-19 NOTE — Progress Notes (Signed)
ANTICOAGULATION CONSULT NOTE - Follow Up Consult  Pharmacy Consult for bivalirudin Indication:  ECMO and Impella  Labs: Recent Labs    04/14/2022 0730 04/19/2022 0934 05/06/2022 1020 04/16/2022 1741 04/16/2022 1812 04/23/2022 2106 04/28/2022 2107 05/04/2022 2301 04/29/2022 2302 04/18/22 0110 04/18/22 0116 04/18/22 0300 04/18/22 0305 04/18/22 0307 04/18/22 1220 04/18/22 1521 04/18/22 1643 04/18/22 1744 04/18/22 2008 04/18/22 2146 04/18/22 2311 04/19/22 0015 04/19/22 0017  HGB 13.0  --    < > 9.8*   < > 10.9*   < >  --    < >  --    < > 10.8*  --    < >  --  9.4*  8.8*   < >  --    < > 7.8* 8.5*  --  8.2*  HCT 38.2*  --    < > 29.4*   < > 32.6*   < >  --    < >  --    < > 31.4*  --    < >  --  26.8*  26.0*   < >  --    < > 23.0* 25.0*  --  24.0*  PLT 276  --   --  162  --  174  --   --   --   --   --  141*  --   --   --  115*  --   --   --   --   --   --   --   APTT  --   --    < > >200*  --  >200*  --  >200*  --   --   --  81*  --   --  55*  --   --  54*  --   --   --  59*  --   LABPROT  --  12.6  --  19.2*  --   --   --   --   --   --   --  15.7*  --   --   --   --   --   --   --   --   --   --   --   INR  --  1.0  --  1.6*  --   --   --   --   --   --   --  1.3*  --   --   --   --   --   --   --   --   --   --   --   HEPARINUNFRC  --   --   --  >1.10*  --   --   --  >1.10*  --   --   --   --  0.30  --   --   --   --   --   --   --   --   --   --   CREATININE 1.62*  --    < > DUPLICATE REQUEST  2.52*  --   --   --   --   --  2.35*  --  2.44*  --   --   --  2.38*  --   --   --   --   --   --   --   TROPONINIHS 99* 1,198*  --   --   --  >24,000*  --   --   --   --   --   --   --   --   --   --   --   --   --   --   --   --   --    < > =  values in this interval not displayed.    Assessment/Plan:  61yo male remains slightly subtherapeutic on bivalirudin but close to goal and RN notes pupillary changes with unclear reason. Will continue infusion at current rate of 0.028 mg/kg/hr for now and  monitor closely.   Vernard Gambles, PharmD, BCPS  04/19/2022,1:20 AM

## 2022-04-19 NOTE — Progress Notes (Signed)
Nutrition Follow-up  DOCUMENTATION CODES:  Not applicable  INTERVENTION:  Tube Feeding Recommendations:  Pivot 1.5 at 60 ml/hr (1.44L/d) Begin at 20 ml/hr; titrate by 10 mL q 8 hours until goal rate of 60 ml/hr once stable to advance Pro-Source TF 45 mL TID Goal regimen provides 164 g of protein, 2280 kcals and 1094 mL of free water   Renal MVI daily  Suggest adding bowel regimen if no BM within 24-48 hours of TF initiation.  NUTRITION DIAGNOSIS:  Inadequate oral intake related to acute illness as evidenced by NPO status. - remains apllicable  GOAL:  Patient will meet greater than or equal to 90% of their needs - progressing, TF initiated  MONITOR:  TF tolerance, Vent status, Labs, Weight trends, Skin  REASON FOR ASSESSMENT:  Consult Enteral/tube feeding initiation and management  ASSESSMENT:  61 yo male admitted with syncopal episode, bradycardia with complete HB, intubated,  developed cardiogenic shock and taken to cath lab for emergent heart cath, PCI.  Impella placed for cardiogenic shock, ultimately required cannulation for VA ECMO. PMH includes HTN, HLD  7/10 - Intubated, brief arrest post PCI, impella placed for persistent shock, VA ECMO via bi-fem cannulation. CRRT initiated 7/12 - cortak tube placed (gastric)   Patient remains intubated on ventilator support. OGT in place but cortrak order placed to exchange tube. MD ok to start trickle feeds. Pt is still requiring pressor support x3.  Family in room at the time of assessment. Report that pt had a good appetite PTA and they have not noticed any major weight changes. Discussed nutrition plan with family.  MAP: ranging from 85-62 over the last 8 hours with 62 being most recent MV: 8.8 L/min Temp (24hrs), Avg:97.5 F (36.4 C), Min:97.2 F (36.2 C), Max:97.7 F (36.5 C)   Intake/Output Summary (Last 24 hours) at 04/19/2022 1130 Last data filed at 04/19/2022 1100 Gross per 24 hour  Intake 4020.65 ml  Output 5131  ml  Net -1110.35 ml  Net IO Since Admission: 5,560.73 mL [04/19/22 1130]  Nutritionally Relevant Medications: Scheduled Meds:  atorvastatin  80 mg Per Tube Daily   insulin aspart  3-9 Units Subcutaneous Q4H   insulin detemir  20 Units Subcutaneous BID   pantoprazole  40 mg Intravenous QHS    epinephrine 7 mcg/min (04/19/22 0900)   feeding supplement (PIVOT 1.5 CAL)     norepinephrine (LEVOPHED) Adult infusion 32 mcg/min (04/19/22 0900)   sodium bicarbonate 150 mEq in sterile water 1,150 mL infusion 125 mL/hr at 04/18/22 0936   vasopressin 0.03 Units/min (04/19/22 0852)   PRN Meds: ondansetron  Labs Reviewed: K 5.7 Creatinine 2.42 Ionized Calcium 1.01 Mg 2.6  NUTRITION - FOCUSED PHYSICAL EXAM: Flowsheet Row Most Recent Value  Orbital Region No depletion  Upper Arm Region No depletion  Thoracic and Lumbar Region Unable to assess  Buccal Region No depletion  Temple Region No depletion  Clavicle Bone Region Unable to assess  Clavicle and Acromion Bone Region Unable to assess  Scapular Bone Region Unable to assess  Dorsal Hand Unable to assess  Patellar Region Unable to assess  Anterior Thigh Region Unable to assess  Posterior Calf Region Unable to assess  Edema (RD Assessment) Severe   Diet Order:   Diet Order             Diet NPO time specified  Diet effective now                   EDUCATION NEEDS:  Not  appropriate for education at this time  Skin:  Skin Assessment: Reviewed RN Assessment  Last BM:  no documented BM, no medications ordered, discussed with RN  Height:  Ht Readings from Last 1 Encounters:  04-19-2022 5\' 8"  (1.727 m)    Weight:  Wt Readings from Last 1 Encounters:  04/19/22 127.8 kg    Ideal Body Weight:  70 kg  BMI:  Body mass index is 42.84 kg/m.  Estimated Nutritional Needs:  Kcal:  2100-2400 kcals Protein:  140-175 g Fluid:  1.8 L   06/20/22, RD, LDN Clinical Dietitian RD pager # available in [facility]  After  hours/weekend pager # available in [facility]

## 2022-04-19 NOTE — Procedures (Addendum)
Cortrak  Person Inserting Tube:  Osa Craver, RD Tube Type:  Cortrak - 55 inches Tube Size:  10 Secured by: Bridle Technique Used to Measure Tube Placement:  Marking at nare/corner of mouth Cortrak Secured At:  78 cm Procedure Comments:  Cortrak Tube Team Note:  Consult received to place a Cortrak feeding tube. Post-pyloric position requested.   Cortrak does not appear to be post-pyloric based on Cortrak reading; await xray  X-ray is required, abdominal x-ray has been ordered by the Cortrak team. Please confirm tube placement before using the Cortrak tube.   If the tube becomes dislodged please keep the tube and contact the Cortrak team at [email or web address] (password [email or web address]) for replacement.  If after hours and replacement cannot be delayed, place a NG tube and confirm placement with an abdominal x-ray.    Romelle Starcher MS, RDN, LDN, CNSC Registered Dietitian 3 Clinical Nutrition RD Pager and On-Call Pager Number Located in [place]

## 2022-04-19 NOTE — Progress Notes (Signed)
Patient ID: ALLISTER LESSLEY, male   DOB: June 12, 1961, 61 y.o.   MRN: [id number] Extracorporeal support note   ECLS support day: 2 Indication: Cardiogenic shock  Configuration: VA ECMO. Distal perfusion right and left SFAs.   Drainage cannula: R femoral vein 23 F Return cannula: L femoral artery 19 F  Pump speed: 3600 rpm Pump flow: 3.78 L/min Cardiohelp  Sweep gas: 1  Circuit check: Stable Anticoagulant: bivalirudin Anticoagulation targets: PTT 60-80  Changes in support: Goal fluid removal, no changes  Anticipated goals/duration of support: Wean off  Marca Ancona MD 04/19/2022, 8:00 AM

## 2022-04-19 NOTE — Progress Notes (Signed)
[facility] Progress Note   Subjective:   Remains critically ill on ecmo.  Discussed with RN.  I/Os 4200 / 4500 - UOP , UF 4L   Objective Vitals:   04/19/22 1000 04/19/22 1015 04/19/22 1030 04/19/22 1116  BP:    (!) 80/64  Pulse:    80  Resp: 16 16 18 16   Temp: (!) 97.2 F (36.2 C) (!) 97.3 F (36.3 C) (!) 97.3 F (36.3 C)   TempSrc:      SpO2:      Weight:      Height:       Physical Exam General: intubated, sedated.  Heart: RRR Lungs: coarse Abdomen: soft, mod distended Extremities: trace diffuse edema  Additional Objective Labs: Basic Metabolic Panel: Recent Labs  Lab 04/18/22 0300 04/18/22 0307 04/18/22 1521 04/18/22 1643 04/19/22 0300 04/19/22 0306 04/19/22 0530 04/19/22 0533 04/19/22 0805  NA 143   < > 140  141   < > 136 136 137  --  137  K 3.1*   < > 3.2*  3.1*   < > 6.2* 6.1* 5.9* 5.9* 5.7*  CL 107  --  101  --  104  --   --   --   --   CO2 21*  --  26  --  23  --   --   --   --   GLUCOSE 216*  --  96  --  175*  --   --   --   --   BUN 17  --  15  --  12  --   --   --   --   CREATININE 2.44*  --  2.38*  --  2.42*  --   --   --   --   CALCIUM 7.2*  --  6.9*  --  6.7*  --   --   --   --   PHOS 2.9  --  3.7  --  3.4  --   --   --   --    < > = values in this interval not displayed.    Liver Function Tests: Recent Labs  Lab 04/18/22 0110 04/18/22 0300 04/18/22 1521 04/19/22 0300  AST 1,402* 1,324*  --  900*  ALT 457* 428*  --  328*  ALKPHOS 39 36*  --  40  BILITOT 0.6 0.3  --  0.4  PROT 4.2* 4.2*  --  4.4*  ALBUMIN 2.4* 2.5* 2.4* 2.6*    No results for input(s): "LIPASE", "AMYLASE" in the last 168 hours. CBC: Recent Labs  Lab 05/04/2022 0730 04/14/2022 1020 04/24/2022 1741 05/02/2022 1812 04/09/2022 2106 04/23/2022 2107 04/18/22 0300 04/18/22 0307 04/18/22 1521 04/18/22 1643 04/19/22 0300 04/19/22 0306 04/19/22 0530 04/19/22 0805  WBC 10.2  --  9.3  --  8.6  --  8.4  --  8.1  --  10.5  --   --   --   NEUTROABS 5.4   --   --   --   --   --   --   --   --   --   --   --   --   --   HGB 13.0   < > 9.8*   < > 10.9*   < > 10.8*   < > 9.4*  8.8*   < > 8.7* 8.2* 8.2* 8.8*  HCT 38.2*   < > 29.4*   < > 32.6*   < >  31.4*   < > 26.8*  26.0*   < > 25.1* 24.0* 24.0* 26.0*  MCV 92.7  --  93.3  --  90.6  --  88.5  --  86.5  --  87.2  --   --   --   PLT 276  --  162  --  174  --  141*  --  115*  --  96*  --   --   --    < > = values in this interval not displayed.    Blood Culture No results found for: "SDES", "SPECREQUEST", "CULT", "REPTSTATUS"  Cardiac Enzymes: No results for input(s): "CKTOTAL", "CKMB", "CKMBINDEX", "TROPONINI" in the last 168 hours. CBG: Recent Labs  Lab 04/18/22 2114 04/18/22 2145 04/18/22 2311 04/19/22 0303 04/19/22 0802  GLUCAP 37* 171* 159* 176* 174*    Iron Studies: No results for input(s): "IRON", "TIBC", "TRANSFERRIN", "FERRITIN" in the last 72 hours. @lablastinr3 @ Studies/Results: DG Abd Portable 1V  Result Date: 04/19/2022 CLINICAL DATA:  Feeding tube placement. EXAM: PORTABLE ABDOMEN - 1 VIEW COMPARISON:  None Available. FINDINGS: The feeding tube tip is in the antropyloric region of the stomach. IMPRESSION: Feeding tube tip is in the antropyloric region of the stomach. Electronically Signed   By: Marijo Sanes M.D.   On: 04/19/2022 11:23   DG CHEST PORT 1 VIEW  Result Date: 04/19/2022 CLINICAL DATA:  [id number] congestive heart failure, on ECMO, follow-up study EXAM: PORTABLE CHEST 1 VIEW COMPARISON:  April 18, 2022 FINDINGS: Again seen are the endotracheal tube, NG tube, left transjugular central venous catheter with its tip at the junction of the left brachiocephalic vein and SVC, left subclavian double-lumen central venous catheter with its tip at the upper SVC. Right transjugular catheter with its tip appears to be in the right ventricle. There is an electrical pad overlying the left lower thorax, obscuring the left lung base stable. Cardiomediastinal silhouette is prominent.  Low lung volumes. Moderate pulmonary vascular congestion and has mildly improved in the interim. Likely mild bilateral pleural effusion. The visualized skeletal structures show moderate thoracic spondylosis. IMPRESSION: 1. There has been no significant interval change in the supporting tubes. 2.  Cardiomediastinal silhouette is prominent. 3. Moderate pulmonary vascular congestion and has improved in the interim. Low lung volumes. Electronically Signed   By: Frazier Richards M.D.   On: 04/19/2022 08:35   ECHOCARDIOGRAM LIMITED  Result Date: 04/18/2022    ECHOCARDIOGRAM LIMITED REPORT   Patient Name:   James White Date of Exam: 04/18/2022 Medical Rec #:  [id number]     Height:       68.0 in Accession #:    [id number]    Weight:       282.2 lb Date of Birth:  [birth year]     BSA:          2.366 m Patient Age:    61 years      BP:           67/61 mmHg Patient Gender: M             HR:           80 bpm. Exam Location:  Inpatient Procedure: Limited Echo and Intracardiac Opacification Agent Indications:    CHF  History:        Patient has prior history of Echocardiogram examinations, most                 recent 04/09/2022. CAD, Cardiac cath 04/17/21; Risk  Factors:Hypertension, Diabetes and Dyslipidemia. Cardiac arrest.                 Renal failure on bedside dialysis. Respiratory failure on ECMO.  Sonographer:    Merrie Roof RDCS Referring Phys: [place]  1. Left ventricular ejection fraction, by estimation, is 40 to 45%. The left ventricle has mildly decreased function. Comparison(s): No significant change from prior study. Prior images reviewed side by side. Definity contrast was used. No Doppler images were performed. FINDINGS  Left Ventricle: No left ventricular thrombus is seen. Impella well poitioned across the aortic valve. Left ventricular ejection fraction, by estimation, is 40 to 45%. The left ventricle has mildly decreased function. Definity contrast agent was given IV  to  delineate the left ventricular endocardial borders.  LV Wall Scoring: The antero-lateral wall and posterior wall are akinetic. The entire anterior wall, entire septum, entire apex, and entire inferior wall are normal. Pericardium: There is no evidence of pericardial effusion. Sanda Klein MD Electronically signed by Sanda Klein MD Signature Date/Time: 04/18/2022/2:11:38 PM    Final    DG CHEST PORT 1 VIEW  Result Date: 04/18/2022 CLINICAL DATA:  Congestive heart failure EXAM: PORTABLE CHEST 1 VIEW COMPARISON:  Chest x-ray dated April 17, 2022 FINDINGS: Stable position of ETT, enteric tube, left IJ approach catheter, left subclavian approach catheter, and Impella device. Stable enlarged cardiac and mediastinal contours. Right-greater-than-left airspace opacities. Small bilateral pleural effusions. No evidence of pneumothorax. IMPRESSION: 1. Stable support devices. 2. Right-greater-than-left airspace opacities, unchanged when compared to prior and likely due to pulmonary edema. Electronically Signed   By: Yetta Glassman M.D.   On: 04/18/2022 08:13   DG Chest Port 1 View  Result Date: 04/16/2022 CLINICAL DATA:  Respiratory failure with intubation, lines placed, ECMO. EXAM: PORTABLE CHEST 1 VIEW COMPARISON:  Portable chest earlier today at 7:50 a.m. FINDINGS: 7:59 p.m. ETT has been inserted and the tip is 4.9 cm from the carina. NGT enters the stomach but the intragastric course is not filmed. Small caliber catheter through a right IJ introducer sheath is difficult to follow distally but I believe it terminates in the right ventricle. There is a left IJ approach catheter terminating at the brachiocephalic/SVC junction, left subclavian approach double-lumen catheter terminating in the upper SVC. On the left there is overlying wiring attached to a partially translucent electrical pad superimposing over the base. Small cylindrical electronic device superimposing at the level of the carina and proximal  ascending aorta is attached to 2 thin parallel wires roughly paralleling the course of the thoracic aorta and extending off of the film in the upper abdomen. The heart is enlarged. There is increased moderate vascular engorgement, perihilar alveolar opacities radiating outward on the right-greater-than-left, and increased moderate right and small left pleural effusions. No pneumothorax is seen. IMPRESSION: 1. Moderate vascular engorgement and perihilar edema on the right-greater-than-left have developed since the earlier film from today. Progress chest films recommended depending on clinical response. 2. Right-greater-than-left pleural effusions. 3. Support apparatus as detailed above. Electronically Signed   By: Telford Nab M.D.   On: 04/18/2022 20:20   ECHOCARDIOGRAM LIMITED  Result Date: 04/16/2022    ECHOCARDIOGRAM LIMITED REPORT   Patient Name:   James White Date of Exam: 04/21/2022 Medical Rec #:  [id number]     Height:       68.0 in Accession #:    [id number]    Weight:       241.0 lb Date of  Birth:  1960/10/17     BSA:          2.212 m Patient Age:    61 years      BP:           68/52 mmHg Patient Gender: M             HR:           70 bpm. Exam Location:  Inpatient Procedure: Limited Echo and Intracardiac Opacification Agent STAT ECHO Indications:    Cardiac Arrest i46.9  History:        Patient has no prior history of Echocardiogram examinations.                 Risk Factors:Hypertension, Diabetes and Dyslipidemia.  Sonographer:    Gertie Fey RDMS, RVT, RDCS Referring Phys: Levon Hedger MD  Sonographer Comments: Stat echo performed in cath lab during impella and ecmo cannulation IMPRESSIONS  1. Left ventricular ejection fraction, by estimation, is 40 to 45%. The left ventricle has mildly decreased function. Normal apical function, hypokinesis in basal segments  2. Right ventricular systolic function is mildly reduced. The right ventricular size is normal.  3. Impella measures 4.4cm into LV  from aortic valve FINDINGS  Left Ventricle: Left ventricular ejection fraction, by estimation, is 40 to 45%. The left ventricle has mildly decreased function. The left ventricle demonstrates regional wall motion abnormalities. Definity contrast agent was given IV to delineate the left ventricular endocardial borders. Right Ventricle: The right ventricular size is normal. Right ventricular systolic function is mildly reduced. Epifanio Lesches MD Electronically signed by Epifanio Lesches MD Signature Date/Time: 04/11/2022/4:44:35 PM    Final    Medications:   prismasol BGK 4/2.5 400 mL/hr at 04/19/22 0535   sodium chloride 10 mL/hr at 04/19/22 1100   sodium chloride     sodium chloride Stopped (05/01/2022 1846)   albumin human Stopped (04/18/22 2028)   bivalirudin (ANGIOMAX) 250 mg in sodium chloride 0.9 % 500 mL (0.5 mg/mL) infusion 0.03 mg/kg/hr (04/19/22 1100)   epinephrine 7 mcg/min (04/19/22 1100)   feeding supplement (PIVOT 1.5 CAL)     fentaNYL infusion INTRAVENOUS 250 mcg/hr (04/19/22 1100)   meropenem (MERREM) IV Stopped (04/19/22 0530)   midazolam 8 mg/hr (04/19/22 1100)   norepinephrine (LEVOPHED) Adult infusion 29 mcg/min (04/19/22 1100)   prismasol BGK 4/2.5 1,500 mL/hr at 04/19/22 1136   sodium bicarbonate 150 mEq in sterile water 1,150 mL infusion 125 mL/hr at 04/18/22 0937   sodium bicarbonate 150 mEq in sterile water 1,150 mL infusion 125 mL/hr at 04/18/22 0936   sodium bicarbonate 25 mEq (Impella PURGE) in dextrose 5 % 1000 mL bag     vasopressin 0.03 Units/min (04/19/22 0852)    amiodarone  200 mg Per Tube BID   artificial tears  1 Application Both Eyes Q8H   aspirin  81 mg Per Tube Daily   atorvastatin  80 mg Per Tube Daily   Chlorhexidine Gluconate Cloth  6 each Topical Daily   fentaNYL (SUBLIMAZE) injection  50 mcg Intravenous Once   insulin aspart  3-9 Units Subcutaneous Q4H   insulin detemir  20 Units Subcutaneous BID   ipratropium-albuterol  3 mL Nebulization  Q4H   midodrine  5 mg Per Tube Q8H   multivitamin  1 tablet Per Tube QHS   mouth rinse  15 mL Mouth Rinse Q2H   pantoprazole (PROTONIX) IV  40 mg Intravenous QHS   sodium chloride flush  10-40 mL Intracatheter Q12H  sodium chloride flush  3 mL Intravenous Q12H   sodium chloride flush  3 mL Intravenous Q12H   ticagrelor  90 mg Per Tube BID    Assessment/Plan:   # AKI in the setting of cardiogenic shock - CRRT orders are in place  - Tolerating UF currently - attempting slightly more net negative    #hyperkalemia:  rec'd multiple rounds of KCl supplementation and has gone from low to high - maintain 4K dialysate for now and check noon K   # PEA arrest - post-arrest supportive care per cardiology and critical care   # Complete heart block  - s/p temp pacer   # Cardiogenic shock  - pressors/inotropes per critical care    # Anemia - acute blood loss  - s/p PRBC's and had bled out from the site of ECMO cannulation.  - PRBC's per primary team    # Acute MI - s/p PCI with cardiology on 7/10   # Acute hypoxic respiratory failure - on vent per critical care and on ecmo   # Metabolic acidosis:  improved - currently looks acceptable with prisma pre/post, not currently on bicarb post filter per RN - follow labs this afternoon     Jannifer Hick MD 04/19/2022, 11:59 AM  [facility] Pager: [phone]

## 2022-04-19 NOTE — Progress Notes (Signed)
Patient ID: James White, male   DOB: 11-30-1960, 61 y.o.   MRN: [id number]     Advanced Heart Failure Rounding Note  PCP-Cardiologist: None   Subjective:    7/10: LAD PCI.  Temporary wire placement.  Impella placed. VA ECMO cannulation.   Stable MAP on NE 30, epinephrine 7.  CVVH running, wt down 1 lb with CVP 15-16.  Pulling fluid now, CXR looks better, sweep down to 1.   Cefepime coverage for aspiration PNA.   Hgb 8.7, mildly lower.  Left groin site stable without further overt bleeding.   Sedation with Versed/Fentanyl. Patient will move extremities.   NSR on amiodarone gtt, no further VT.   ECMO: 3600 rpm Flow 3.78 Pven -108 DeltaP 19 ABG 7.46/35/135, O2 sat 99% LDH 1992 => 1902 Co-ox 51% Lactate >9 => 7.8 => 3 => 3.5 Vent FiO2 0.5 Bivalirudin gtt with goal PTT 60-80  Impella CP: P1  Flow 0.9 L/min   Objective:   Weight Range: 127.8 kg Body mass index is 42.84 kg/m.   Vital Signs:   Temp:  [97.2 F (36.2 C)-97.7 F (36.5 C)] 97.2 F (36.2 C) (07/12 0700) Pulse Rate:  [78-138] 80 (07/12 0726) Resp:  [0-20] 16 (07/12 0726) BP: (70-94)/(58-81) 78/61 (07/12 0726) SpO2:  [93 %-100 %] 100 % (07/12 0530) Arterial Line BP: (67-229)/(40-222) 82/64 (07/12 0700) FiO2 (%):  [40 %-50 %] 50 % (07/12 0726) Weight:  [127.8 kg] 127.8 kg (07/12 0500) Last BM Date :  (PTA)  Weight change: Filed Weights   04/23/2022 0724 04/18/22 0500 04/19/22 0500  Weight: 109.3 kg 128 kg 127.8 kg    Intake/Output:   Intake/Output Summary (Last 24 hours) at 04/19/2022 0802 Last data filed at 04/19/2022 0700 Gross per 24 hour  Intake 3978.88 ml  Output 4547 ml  Net -568.12 ml      Physical Exam    General: NAD, sedated on vent Neck: Thick, JVP difficult, no thyromegaly or thyroid nodule.  Lungs: Clear to auscultation bilaterally with normal respiratory effort. CV: Nondisplaced PMI.  Heart regular S1/S2, no S3/S4, no murmur.  1+ ankle edema.    Abdomen: Soft, nontender, no  hepatosplenomegaly, mild distention.  Skin: Intact without lesions or rashes.  Neurologic: Will move extremities Extremities: No clubbing or cyanosis.  HEENT: Normal.   Telemetry   V-paced rate 80, personally reviewed.   Labs    CBC Recent Labs    04/27/2022 0730 04/30/2022 1020 04/18/22 1521 04/18/22 1643 04/19/22 0300 04/19/22 0306 04/19/22 0530  WBC 10.2   < > 8.1  --  10.5  --   --   NEUTROABS 5.4  --   --   --   --   --   --   HGB 13.0   < > 9.4*  8.8*   < > 8.7* 8.2* 8.2*  HCT 38.2*   < > 26.8*  26.0*   < > 25.1* 24.0* 24.0*  MCV 92.7   < > 86.5  --  87.2  --   --   PLT 276   < > 115*  --  96*  --   --    < > = values in this interval not displayed.   Basic Metabolic Panel Recent Labs    62/95/28 1521 04/18/22 1643 04/18/22 2115 04/18/22 2146 04/19/22 0300 04/19/22 0306 04/19/22 0530 04/19/22 0533  NA 140  141   < >  --    < > 136 136 137  --  K 3.2*  3.1*   < > 2.8*   < > 6.2* 6.1* 5.9* 5.9*  CL 101  --   --   --  104  --   --   --   CO2 26  --   --   --  23  --   --   --   GLUCOSE 96  --   --   --  175*  --   --   --   BUN 15  --   --   --  12  --   --   --   CREATININE 2.38*  --   --   --  2.42*  --   --   --   CALCIUM 6.9*  --   --   --  6.7*  --   --   --   MG  --   --  1.5*  --  2.6*  --   --   --   PHOS 3.7  --   --   --  3.4  --   --   --    < > = values in this interval not displayed.   Liver Function Tests Recent Labs    04/18/22 0300 04/18/22 1521 04/19/22 0300  AST 1,324*  --  900*  ALT 428*  --  328*  ALKPHOS 36*  --  40  BILITOT 0.3  --  0.4  PROT 4.2*  --  4.4*  ALBUMIN 2.5* 2.4* 2.6*   No results for input(James): "LIPASE", "AMYLASE" in the last 72 hours. Cardiac Enzymes No results for input(James): "CKTOTAL", "CKMB", "CKMBINDEX", "TROPONINI" in the last 72 hours.  BNP: BNP (last 3 results) No results for input(James): "BNP" in the last 8760 hours.  ProBNP (last 3 results) No results for input(James): "PROBNP" in the last 8760  hours.   D-Dimer No results for input(James): "DDIMER" in the last 72 hours. Hemoglobin A1C Recent Labs    04/18/22 0300  HGBA1C 7.1*   Fasting Lipid Panel Recent Labs    04/18/22 0300  CHOL 149  HDL 25*  LDLCALC 86  TRIG 191*  CHOLHDL 6.0   Thyroid Function Tests No results for input(James): "TSH", "T4TOTAL", "T3FREE", "THYROIDAB" in the last 72 hours.  Invalid input(James): "FREET3"  Other results:   Imaging    ECHOCARDIOGRAM LIMITED  Result Date: 04/18/2022    ECHOCARDIOGRAM LIMITED REPORT   Patient Name:   James White James White Date of Exam: 04/18/2022 Medical Rec #:  [id number]     Height:       68.0 in Accession #:    [id number]    Weight:       282.2 lb Date of Birth:  November 02, 1960     BSA:          2.366 m Patient Age:    59 years      BP:           67/61 mmHg Patient Gender: M             HR:           80 bpm. Exam Location:  Inpatient Procedure: Limited Echo and Intracardiac Opacification Agent Indications:    CHF  History:        Patient has prior history of Echocardiogram examinations, most                 recent 04/09/2022. CAD, Cardiac cath 04/17/21; Risk  Factors:Hypertension, Diabetes and Dyslipidemia. Cardiac arrest.                 Renal failure on bedside dialysis. Respiratory failure on ECMO.  Sonographer:    James White Referring Phys: [id number] James White James White IMPRESSIONS  1. Left ventricular ejection fraction, by estimation, is 40 to 45%. The left ventricle has mildly decreased function. Comparison(James): No significant change from prior study. Prior images reviewed side by side. Definity contrast was used. No Doppler images were performed. FINDINGS  Left Ventricle: No left ventricular thrombus is seen. Impella well poitioned across the aortic valve. Left ventricular ejection fraction, by estimation, is 40 to 45%. The left ventricle has mildly decreased function. Definity contrast agent was given IV  to delineate the left ventricular endocardial borders.  LV Wall Scoring:  The antero-lateral wall and posterior wall are akinetic. The entire anterior wall, entire septum, entire apex, and entire inferior wall are normal. Pericardium: There is no evidence of pericardial effusion. James Hora Croitoru MD Electronically signed by James Fair MD Signature Date/Time: 04/18/2022/2:11:38 PM    Final      Medications:     Scheduled Medications:  amiodarone  200 mg Per Tube BID   artificial tears  1 Application Both Eyes Q8H   aspirin  81 mg Per Tube Daily   atorvastatin  80 mg Per Tube Daily   Chlorhexidine Gluconate Cloth  6 each Topical Daily   fentaNYL (SUBLIMAZE) injection  50 mcg Intravenous Once   insulin aspart  3-9 Units Subcutaneous Q4H   insulin detemir  20 Units Subcutaneous BID   ipratropium-albuterol  3 mL Nebulization Q4H   midodrine  5 mg Per Tube Q8H   mouth rinse  15 mL Mouth Rinse Q2H   pantoprazole (PROTONIX) IV  40 mg Intravenous QHS   sodium chloride flush  10-40 mL Intracatheter Q12H   sodium chloride flush  3 mL Intravenous Q12H   sodium chloride flush  3 mL Intravenous Q12H   ticagrelor  90 mg Per Tube BID    Infusions:   prismasol BGK 4/2.5 400 mL/hr at 04/19/22 0535   sodium chloride 10 mL/hr at 04/19/22 0700   sodium chloride     sodium chloride Stopped (2022/04/23 1846)   albumin human Stopped (04/18/22 2028)   bivalirudin (ANGIOMAX) 250 mg in sodium chloride 0.9 % 500 mL (0.5 mg/mL) infusion 0.028 mg/kg/hr (04/19/22 0700)   epinephrine 7 mcg/min (04/19/22 0700)   fentaNYL infusion INTRAVENOUS 275 mcg/hr (04/19/22 0700)   meropenem (MERREM) IV Stopped (04/19/22 0530)   midazolam 8 mg/hr (04/19/22 0700)   norepinephrine (LEVOPHED) Adult infusion 30 mcg/min (04/19/22 0700)   prismasol BGK 4/2.5 1,500 mL/hr at 04/18/22 2323   sodium bicarbonate 150 mEq in sterile water 1,150 mL infusion 125 mL/hr at 04/18/22 0937   sodium bicarbonate 150 mEq in sterile water 1,150 mL infusion 125 mL/hr at 04/18/22 0936   sodium bicarbonate 25 mEq  (Impella PURGE) in dextrose 5 % 1000 mL bag     vasopressin Stopped (04/18/22 0411)    PRN Medications: sodium chloride, sodium chloride, acetaminophen, albumin human, etomidate, fentaNYL, heparin, midazolam, midazolam, midazolam, ondansetron (ZOFRAN) IV, mouth rinse, sodium chloride, sodium chloride flush, sodium chloride flush    Assessment/Plan   1. Cardiogenic shock: Ischemic cardiomyopathy.  After PCI to LM/LAD, Impella, and VA ECMO, the LV EF appeared to be around 40% with mild RV systolic dysfunction.  Echo on 7/11 with EF around 40%.  Patient is currently on epinephrine 7,  norepinephrine 30, off vasopressin.  Impella for LV vent at P1 with flow 0.9 L/min.  VA ECMO ongoing, stable overnight. Lactate down to 3 but mildly higher this morning at 3.5.  CVVH ongoing for fluid removal, CVP 15-16. CXR improved.  - Continue fluid removal via CVVH.    - Wean pressors as able. Add midodrine 5 mg tid.  - Continue Impella P1 - Continue VA ECMO. When he has move fluid off (Thurs/Fri), will aim to wean ECMO ideally to Impella CP (could end up needing Impella 5.5).  - Continue bivalirudin gtt for ECMO and Impella, goal PTT 60-80.  2. CAD: Presented with posterior MI and occluded ostial LCx as likely culprit.  Unable to wire the LCx.  Up to 95% stenosis left main into LAD, DES LM to proximal LAD.   - Continue ticagrelor.  - statin.  - ASA 81.  3. VT arrest: In setting of bleeding from left groin.  DCCV x 1 with no CPR.  - Transition amiodarone to per tube.   4. PEA arrest: Post-PCI.  Required about 2 minutes CPR then resolved with epinephrine/HCO3.  5. Acute hypoxemic respiratory failure: Volume overload/pulmonary edema.  Now managed by VA ECMO.  CXR with pulmonary edema, improved today.  Vent FiO2 0.5 with sweep down to 1, good gas exchange.  CCM following.  - Vent per CCM.  - Needs volume removed via CVVH, see above.  - Covering for possible aspiration PNA with cefepime.  6. Arterial cannula  site bleeding: Bolsters sutured in place, bleeding stopped.  VVS following.  7. Anemia: Hgb 8.7 this morning.  Transfuse to keep hgb > 8.  8. AKI on ?CKD 3: Creatinine 1.6 at admission, unsure of baseline.  Contrast and hypotension/shock. CVVH started on 7/10.  - Will need CVVH as above.  9. Thrombocytopenia: Critical illness and blood products.  Mild so far.  - Continue bivalirudin.  10. Type 2 diabetes: Insulin gtt.  11. Complete heart block: Has temporary transvenous pacemaker now, likely related to posterior MI.  Currently pacing rate 80. 12. FEN: Will need Cortrack today for tube feeds.  13. Elevated LFTs: Suspect shock liver, decreasing.   CRITICAL CARE Performed by: Loralie Champagne  Total critical care time: 45 minutes  Critical care time was exclusive of separately billable procedures and treating other patients.  Critical care was necessary to treat or prevent imminent or life-threatening deterioration.  Critical care was time spent personally by me on the following activities: development of treatment plan with patient and/or surrogate as well as nursing, discussions with consultants, evaluation of patient'James response to treatment, examination of patient, obtaining history from patient or surrogate, ordering and performing treatments and interventions, ordering and review of laboratory studies, ordering and review of radiographic studies, pulse oximetry and re-evaluation of patient'James condition.     Length of Stay: 2  Loralie Champagne, MD  04/19/2022, 8:02 AM  Advanced Heart Failure Team Pager [phone] (M-F; 7a - 5p)  Please contact [facility] for night-coverage after hours (5p -7a ) and weekends on [email or web address]

## 2022-04-19 NOTE — Progress Notes (Signed)
NAME:  James White, MRN:  [id number], DOB:  [birth year], LOS: 2 ADMISSION DATE:  04/20/22, CONSULTATION DATE:  04/19/22 REFERRING MD:  Particia Nearing, CHIEF COMPLAINT:  syncope, HG   History of Present Illness:  James White is a 61 y.o. M with PMH of HTN and HL who presented to the ED with a syncopal episode while at work.  History taken from epic and patient was intubated.  He noted feeling poorly for several days prior and thought this was secondary to the head.  He went to work this morning and became diaphoretic and passed out.  On EMS arrival he was hypotensive and bradycardic as low as 29, EKG concerning for complete heart bloack.  He was intubated in the ED. Labs significant for trop of 99, creatinine 1.6, K 3.6, glucose >500 initially.  He was externally paced, then in intermittent atrial fib.  and seen by EP, plan for ICU admission and temp pacer with L and R heart cath.  PCCM consulted for ventilator management  Pertinent  Medical History   has a past medical history of High cholesterol and Hypertension.   Significant Hospital Events: Including procedures, antibiotic start and stop dates in addition to other pertinent events   7/10 Syncopal episode, bradycardia and complete HB, intubated, plan for temp pacer and cath lab, PCCM consult 7/10 DES to LAD, CX lesion. RCA patent.  7/10 brief arrest, temporary wire, VA ECMO via bifemoral cannulation, CP Impella to vent.  7/10 bleeding from left arterial cannulation site.  Bolsters placed by VVS 7/11 Improving pulsatility and oxygenation.   Interim History / Subjective:   Increasing pulsatility with [employer] last night. Ventilator increased but settings remain lung protective.   Objective   Blood pressure (!) 78/61, pulse 80, temperature (!) 97.2 F (36.2 C), resp. rate 16, height 5\' 8"  (1.727 m), weight 127.8 kg, SpO2 100 %. CVP:  [0 mmHg-53 mmHg] 14 mmHg  Vent Mode: PCV FiO2 (%):  [40 %-50 %] 50 % Set Rate:  [16 bmp-20 bmp] 16  bmp PEEP:  [10 cmH20] 10 cmH20 Plateau Pressure:  [24 cmH20-28 cmH20] 28 cmH20   Intake/Output Summary (Last 24 hours) at 04/19/2022 0823 Last data filed at 04/19/2022 0800 Gross per 24 hour  Intake 4088.1 ml  Output 4755 ml  Net -666.9 ml    Filed Weights   Apr 20, 2022 0724 04/18/22 0500 04/19/22 0500  Weight: 109.3 kg 128 kg 127.8 kg    General:  critically ill-appearing M, intubated and sedated  HEENT: ++ facial swelling with scleral edema. ETT, OGT in place. Swelling improving.  Neuro: Sedated, off NMB, will frown to eye opening. Will move all limbs.  CV: VA ECMO 3.7 Lpm , Impella P1, currently on E, NE with 06/20/22 of pulsatility. Extremities warm.  Sweep at 1 PULM:  PC 20/10 Vt 450 and Pplat 27. Crackles a both bases.  GI: soft, obese, soft, no bowel sounds.  Extremities: ++ anasarca. Line sites intact. No bleeding from sites.  Skin: left flank is soft.   Ancillary tests personally reviewed:   7.466/34/141  Assessment & Plan:   Cardiogenic shock [facility]) Following STEMI. Now requiring ECMO support. Impella for LV venting. Improving LV function by echo.  -Continue ECMO support and monitor for LV recovery.  - Wean E/NE to keep MAP >70 and maintain 10 of pulsatility - Improving pulsatility - Echo tomorrow to assess recovery.   Complete heart block (HCC) - TVPM set at 80 and requiring intermittent pacing with good capture.  CAD (coronary artery disease) - Start lipitor 80 - ASA and Brillinta for stent.  Cardiac arrest (HCC)  Unable to assess due to sedation. BIS in 60's initially suggests some neurologic function.   - Slow sedation wean to obtain exam.   Acute on chronic respiratory failure with hypoxia and hypercapnia (HCC) On VA ECMO support.  Reasonable Vt and MV on rest settings. Lung compliance improving.    - Maintain current ventilator settings. Oxygenation should improve with fluid removal.  - Will likely be able to come off VA soon and transition to Impella  alone.   Acute kidney injury (AKI) with acute tubular necrosis (ATN) (HCC) Minimal urine output.  Now on CRRT.  - Aggressive fluid removal as tolerated.  - Normal bicarb bath.    Hemorrhage due to vascular catheter (HCC) No bleeding from site.   - Monitor for further bleeding - Vascular surgery to close when ready to come off.   STEMI (ST elevation myocardial infarction) (HCC) S/P DES to LAD.  - Continue to monitor for LV recovery.   Best Practice (right click and "Reselect all SmartList Selections" daily)   Diet/type: NPO Start trickle feeds today  DVT prophylaxis: Systemic bivalirudin GI prophylaxis: PPI Lines: Right TVPM, L IJ TLC, L [place] HD catheter, RF CP, RF drainage, LF return.  Foley:  Yes, and it is still needed Code Status:  full code Last date of multidisciplinary goals of care discussion [per primary]  CRITICAL CARE Performed by: Lynnell Catalan  Total critical care time: 50 minutes  Critical care time was exclusive of separately billable procedures and treating other patients.  Critical care was necessary to treat or prevent imminent or life-threatening deterioration.  Critical care was time spent personally by me on the following activities: development of treatment plan with patient and/or surrogate as well as nursing, discussions with consultants, evaluation of patient's response to treatment, examination of patient, obtaining history from patient or surrogate, ordering and performing treatments and interventions, ordering and review of laboratory studies, ordering and review of radiographic studies, pulse oximetry and re-evaluation of patient's condition.  Lynnell Catalan, MD [facility] ICU Physician [facility]  Pager: [phone] Or Epic Secure Chat After hours: [phone].  04/19/2022, 8:23 AM

## 2022-04-19 NOTE — Progress Notes (Addendum)
  Progress Note    04/19/2022 9:13 AM 2 Days Post-Op  Subjective:  intubated and sedated   Vitals:   04/19/22 0815 04/19/22 0830  BP:    Pulse:    Resp: 16 16  Temp: (!) 97.2 F (36.2 C) (!) 97.2 F (36.2 C)  SpO2:     Physical Exam: Lungs:  mechanical ventilation Incisions:  L groin cannulation site without firm hematoma or bleeding Extremities:  feet cool to touch with venous like DP signals bilaterally Abdomen:  soft Neurologic: sedated  CBC    Component Value Date/Time   WBC 10.5 04/19/2022 0300   RBC 2.88 (L) 04/19/2022 0300   HGB 8.8 (L) 04/19/2022 0805   HCT 26.0 (L) 04/19/2022 0805   PLT 96 (L) 04/19/2022 0300   MCV 87.2 04/19/2022 0300   MCH 30.2 04/19/2022 0300   MCHC 34.7 04/19/2022 0300   RDW 16.0 (H) 04/19/2022 0300   LYMPHSABS 3.6 May 02, 2022 0730   MONOABS 0.9 05/02/22 0730   EOSABS 0.2 May 02, 2022 0730   BASOSABS 0.1 05-02-22 0730    BMET    Component Value Date/Time   NA 137 04/19/2022 0805   K 5.7 (H) 04/19/2022 0805   CL 104 04/19/2022 0300   CO2 23 04/19/2022 0300   GLUCOSE 175 (H) 04/19/2022 0300   BUN 12 04/19/2022 0300   CREATININE 2.42 (H) 04/19/2022 0300   CALCIUM 6.7 (L) 04/19/2022 0300   GFRNONAA 30 (L) 04/19/2022 0300    INR    Component Value Date/Time   INR 1.4 (H) 04/19/2022 0300     Intake/Output Summary (Last 24 hours) at 04/19/2022 0913 Last data filed at 04/19/2022 0900 Gross per 24 hour  Intake 4022.37 ml  Output 4872 ml  Net -849.63 ml     Assessment/Plan:  61 y.o. male with bleeding around ECMO cannula site 2 nights prior   L groin continues to be hemostatic; no firm hematoma or bleeding on exam H&H stable; daily lab work has been ordered Vascular will continue to follow   Emilie Rutter, PA-C [facility] [phone] 04/19/2022 9:13 AM  I have seen and evaluated the patient. I agree with the PA note as documented above.  Left groin arterial ECMO cannula remains hemostatic  with bolsters placed Monday night.  Patient is improving clinically although remains critically ill.  Have discussed with critical care and cardiology.  We will tentatively post for Friday for decannulation of the left femoral ECMO cannula with repair of the common femoral artery.  Cephus Shelling, MD [facility] [place] Office: [phone]

## 2022-04-20 ENCOUNTER — Inpatient Hospital Stay (HOSPITAL_COMMUNITY): Payer: Medicaid Other

## 2022-04-20 DIAGNOSIS — R570 Cardiogenic shock: Secondary | ICD-10-CM

## 2022-04-20 DIAGNOSIS — I255 Ischemic cardiomyopathy: Secondary | ICD-10-CM

## 2022-04-20 DIAGNOSIS — N170 Acute kidney failure with tubular necrosis: Secondary | ICD-10-CM

## 2022-04-20 LAB — CBC
HCT: 24.5 % — ABNORMAL LOW (ref 39.0–52.0)
HCT: 23.4 % — ABNORMAL LOW (ref 39.0–52.0)
HCT: 23.9 % — ABNORMAL LOW (ref 39.0–52.0)
Hemoglobin: 8 g/dL — ABNORMAL LOW (ref 13.0–17.0)
Hemoglobin: 7.4 g/dL — ABNORMAL LOW (ref 13.0–17.0)
Hemoglobin: 7.6 g/dL — ABNORMAL LOW (ref 13.0–17.0)
MCH: 30.1 pg (ref 26.0–34.0)
MCH: 30 pg (ref 26.0–34.0)
MCH: 30.3 pg (ref 26.0–34.0)
MCHC: 32.7 g/dL (ref 30.0–36.0)
MCHC: 31.6 g/dL (ref 30.0–36.0)
MCHC: 31.8 g/dL (ref 30.0–36.0)
MCV: 92.1 fL (ref 80.0–100.0)
MCV: 94.7 fL (ref 80.0–100.0)
MCV: 95.2 fL (ref 80.0–100.0)
Platelets: 89 10*3/uL — ABNORMAL LOW (ref 150–400)
Platelets: 73 10*3/uL — ABNORMAL LOW (ref 150–400)
Platelets: 76 10*3/uL — ABNORMAL LOW (ref 150–400)
RBC: 2.66 MIL/uL — ABNORMAL LOW (ref 4.22–5.81)
RBC: 2.47 MIL/uL — ABNORMAL LOW (ref 4.22–5.81)
RBC: 2.51 MIL/uL — ABNORMAL LOW (ref 4.22–5.81)
RDW: 16.8 % — ABNORMAL HIGH (ref 11.5–15.5)
RDW: 16.6 % — ABNORMAL HIGH (ref 11.5–15.5)
RDW: 16.9 % — ABNORMAL HIGH (ref 11.5–15.5)
WBC: 9.9 10*3/uL (ref 4.0–10.5)
WBC: 8.8 10*3/uL (ref 4.0–10.5)
WBC: 9 10*3/uL (ref 4.0–10.5)
nRBC: 0 % (ref 0.0–0.2)
nRBC: 0.2 % (ref 0.0–0.2)
nRBC: 0.3 % — ABNORMAL HIGH (ref 0.0–0.2)

## 2022-04-20 LAB — LACTIC ACID, PLASMA
Lactic Acid, Venous: 1.7 mmol/L (ref 0.5–1.9)
Lactic Acid, Venous: 1.4 mmol/L (ref 0.5–1.9)
Lactic Acid, Venous: 1.4 mmol/L (ref 0.5–1.9)

## 2022-04-20 LAB — BLOOD GAS, ARTERIAL
Acid-Base Excess: 1.5 mmol/L (ref 0.0–2.0)
Bicarbonate: 27.7 mmol/L (ref 20.0–28.0)
O2 Saturation: 100 %
Patient temperature: 36.3
pCO2 arterial: 48 mmHg (ref 32–48)
pH, Arterial: 7.37 (ref 7.35–7.45)
pO2, Arterial: 271 mmHg — ABNORMAL HIGH (ref 83–108)

## 2022-04-20 LAB — POCT I-STAT 7, (LYTES, BLD GAS, ICA,H+H)
Acid-Base Excess: 4 mmol/L — ABNORMAL HIGH (ref 0.0–2.0)
Acid-Base Excess: 3 mmol/L — ABNORMAL HIGH (ref 0.0–2.0)
Acid-Base Excess: 3 mmol/L — ABNORMAL HIGH (ref 0.0–2.0)
Acid-Base Excess: 0 mmol/L (ref 0.0–2.0)
Bicarbonate: 29.2 mmol/L — ABNORMAL HIGH (ref 20.0–28.0)
Bicarbonate: 27.8 mmol/L (ref 20.0–28.0)
Bicarbonate: 29.5 mmol/L — ABNORMAL HIGH (ref 20.0–28.0)
Bicarbonate: 26.8 mmol/L (ref 20.0–28.0)
Calcium, Ion: 1.03 mmol/L — ABNORMAL LOW (ref 1.15–1.40)
Calcium, Ion: 1.07 mmol/L — ABNORMAL LOW (ref 1.15–1.40)
Calcium, Ion: 1.08 mmol/L — ABNORMAL LOW (ref 1.15–1.40)
Calcium, Ion: 1.12 mmol/L — ABNORMAL LOW (ref 1.15–1.40)
HCT: 23 % — ABNORMAL LOW (ref 39.0–52.0)
HCT: 24 % — ABNORMAL LOW (ref 39.0–52.0)
HCT: 21 % — ABNORMAL LOW (ref 39.0–52.0)
HCT: 22 % — ABNORMAL LOW (ref 39.0–52.0)
Hemoglobin: 7.8 g/dL — ABNORMAL LOW (ref 13.0–17.0)
Hemoglobin: 8.2 g/dL — ABNORMAL LOW (ref 13.0–17.0)
Hemoglobin: 7.1 g/dL — ABNORMAL LOW (ref 13.0–17.0)
Hemoglobin: 7.5 g/dL — ABNORMAL LOW (ref 13.0–17.0)
O2 Saturation: 100 %
O2 Saturation: 100 %
O2 Saturation: 97 %
O2 Saturation: 98 %
Patient temperature: 36.1
Patient temperature: 36.3
Patient temperature: 36.2
Patient temperature: 36.3
Potassium: 5.8 mmol/L — ABNORMAL HIGH (ref 3.5–5.1)
Potassium: 4.9 mmol/L (ref 3.5–5.1)
Potassium: 4.9 mmol/L (ref 3.5–5.1)
Potassium: 5 mmol/L (ref 3.5–5.1)
Sodium: 136 mmol/L (ref 135–145)
Sodium: 136 mmol/L (ref 135–145)
Sodium: 137 mmol/L (ref 135–145)
Sodium: 136 mmol/L (ref 135–145)
TCO2: 31 mmol/L (ref 22–32)
TCO2: 29 mmol/L (ref 22–32)
TCO2: 31 mmol/L (ref 22–32)
TCO2: 29 mmol/L (ref 22–32)
pCO2 arterial: 47 mmHg (ref 32–48)
pCO2 arterial: 41.8 mmHg (ref 32–48)
pCO2 arterial: 56.3 mmHg — ABNORMAL HIGH (ref 32–48)
pCO2 arterial: 57 mmHg — ABNORMAL HIGH (ref 32–48)
pH, Arterial: 7.398 (ref 7.35–7.45)
pH, Arterial: 7.428 (ref 7.35–7.45)
pH, Arterial: 7.324 — ABNORMAL LOW (ref 7.35–7.45)
pH, Arterial: 7.276 — ABNORMAL LOW (ref 7.35–7.45)
pO2, Arterial: 302 mmHg — ABNORMAL HIGH (ref 83–108)
pO2, Arterial: 213 mmHg — ABNORMAL HIGH (ref 83–108)
pO2, Arterial: 92 mmHg (ref 83–108)
pO2, Arterial: 118 mmHg — ABNORMAL HIGH (ref 83–108)

## 2022-04-20 LAB — TYPE AND SCREEN
ABO/RH(D): O POS
Antibody Screen: NEGATIVE
Unit division: 0
Unit division: 0
Unit division: 0
Unit division: 0
Unit division: 0
Unit division: 0
Unit division: 0
Unit division: 0
Unit division: 0
Unit division: 0
Unit division: 0
Unit division: 0
Unit division: 0
Unit division: 0
Unit division: 0
Unit division: 0

## 2022-04-20 LAB — COMPREHENSIVE METABOLIC PANEL
ALT: 295 U/L — ABNORMAL HIGH (ref 0–44)
AST: 632 U/L — ABNORMAL HIGH (ref 15–41)
Albumin: 2.7 g/dL — ABNORMAL LOW (ref 3.5–5.0)
Alkaline Phosphatase: 49 U/L (ref 38–126)
Anion gap: 7 (ref 5–15)
BUN: 14 mg/dL (ref 8–23)
CO2: 26 mmol/L (ref 22–32)
Calcium: 7.3 mg/dL — ABNORMAL LOW (ref 8.9–10.3)
Chloride: 105 mmol/L (ref 98–111)
Creatinine, Ser: 2.63 mg/dL — ABNORMAL HIGH (ref 0.61–1.24)
GFR, Estimated: 27 mL/min — ABNORMAL LOW (ref >60)
Glucose, Bld: 140 mg/dL — ABNORMAL HIGH (ref 70–99)
Potassium: 5.8 mmol/L — ABNORMAL HIGH (ref 3.5–5.1)
Sodium: 138 mmol/L (ref 135–145)
Total Bilirubin: 0.5 mg/dL (ref 0.3–1.2)
Total Protein: 5.3 g/dL — ABNORMAL LOW (ref 6.5–8.1)

## 2022-04-20 LAB — BPAM RBC
Blood Product Expiration Date: 202308062359
Blood Product Expiration Date: 202308092359
Blood Product Expiration Date: 202308102359
Blood Product Expiration Date: 202308102359
Blood Product Expiration Date: 202308112359
Blood Product Expiration Date: 202308112359
Blood Product Expiration Date: 202308112359
Blood Product Expiration Date: 202308112359
Blood Product Expiration Date: 202308112359
Blood Product Expiration Date: 202308112359
Blood Product Expiration Date: 202308112359
Blood Product Expiration Date: 202308112359
Blood Product Expiration Date: 202308122359
Blood Product Expiration Date: 202308122359
Blood Product Expiration Date: 202308122359
Blood Product Expiration Date: 202308122359
ISSUE DATE / TIME: 202307101437
ISSUE DATE / TIME: 202307101437
ISSUE DATE / TIME: 202307101437
ISSUE DATE / TIME: 202307101441
ISSUE DATE / TIME: 202307101611
ISSUE DATE / TIME: 202307101611
ISSUE DATE / TIME: 202307101611
ISSUE DATE / TIME: 202307101611
ISSUE DATE / TIME: 202307110431
ISSUE DATE / TIME: 202307110431
ISSUE DATE / TIME: 202307110431
ISSUE DATE / TIME: 202307110431
ISSUE DATE / TIME: 202307111041
ISSUE DATE / TIME: 202307111055
ISSUE DATE / TIME: 202307111300
ISSUE DATE / TIME: 202307111338
Unit Type and Rh: 5100
Unit Type and Rh: 5100
Unit Type and Rh: 5100
Unit Type and Rh: 5100
Unit Type and Rh: 5100
Unit Type and Rh: 5100
Unit Type and Rh: 5100
Unit Type and Rh: 5100
Unit Type and Rh: 5100
Unit Type and Rh: 5100
Unit Type and Rh: 5100
Unit Type and Rh: 5100
Unit Type and Rh: 5100
Unit Type and Rh: 5100
Unit Type and Rh: 5100
Unit Type and Rh: 5100

## 2022-04-20 LAB — PROTIME-INR
INR: 1.4 — ABNORMAL HIGH (ref 0.8–1.2)
INR: 1.4 — ABNORMAL HIGH (ref 0.8–1.2)
Prothrombin Time: 17.1 seconds — ABNORMAL HIGH (ref 11.4–15.2)
Prothrombin Time: 16.9 seconds — ABNORMAL HIGH (ref 11.4–15.2)

## 2022-04-20 LAB — GLUCOSE, CAPILLARY
Glucose-Capillary: 554 mg/dL (ref 70–99)
Glucose-Capillary: 520 mg/dL (ref 70–99)
Glucose-Capillary: 139 mg/dL — ABNORMAL HIGH (ref 70–99)
Glucose-Capillary: 119 mg/dL — ABNORMAL HIGH (ref 70–99)
Glucose-Capillary: 135 mg/dL — ABNORMAL HIGH (ref 70–99)
Glucose-Capillary: 144 mg/dL — ABNORMAL HIGH (ref 70–99)
Glucose-Capillary: 145 mg/dL — ABNORMAL HIGH (ref 70–99)

## 2022-04-20 LAB — BASIC METABOLIC PANEL
Anion gap: 8 (ref 5–15)
BUN: 16 mg/dL (ref 8–23)
CO2: 25 mmol/L (ref 22–32)
Calcium: 7.4 mg/dL — ABNORMAL LOW (ref 8.9–10.3)
Chloride: 104 mmol/L (ref 98–111)
Creatinine, Ser: 2.5 mg/dL — ABNORMAL HIGH (ref 0.61–1.24)
GFR, Estimated: 29 mL/min — ABNORMAL LOW (ref >60)
Glucose, Bld: 149 mg/dL — ABNORMAL HIGH (ref 70–99)
Potassium: 5 mmol/L (ref 3.5–5.1)
Sodium: 137 mmol/L (ref 135–145)

## 2022-04-20 LAB — RENAL FUNCTION PANEL
Albumin: 2.4 g/dL — ABNORMAL LOW (ref 3.5–5.0)
Anion gap: 9 (ref 5–15)
BUN: 16 mg/dL (ref 8–23)
CO2: 24 mmol/L (ref 22–32)
Calcium: 7.3 mg/dL — ABNORMAL LOW (ref 8.9–10.3)
Chloride: 103 mmol/L (ref 98–111)
Creatinine, Ser: 2.52 mg/dL — ABNORMAL HIGH (ref 0.61–1.24)
GFR, Estimated: 28 mL/min — ABNORMAL LOW (ref >60)
Glucose, Bld: 146 mg/dL — ABNORMAL HIGH (ref 70–99)
Phosphorus: 4.7 mg/dL — ABNORMAL HIGH (ref 2.5–4.6)
Potassium: 4.9 mmol/L (ref 3.5–5.1)
Sodium: 136 mmol/L (ref 135–145)

## 2022-04-20 LAB — PREPARE RBC (CROSSMATCH)

## 2022-04-20 LAB — FIBRINOGEN: Fibrinogen: 759 mg/dL — ABNORMAL HIGH (ref 210–475)

## 2022-04-20 LAB — APTT
aPTT: 59 seconds — ABNORMAL HIGH (ref 24–36)
aPTT: 63 seconds — ABNORMAL HIGH (ref 24–36)

## 2022-04-20 LAB — ECHOCARDIOGRAM LIMITED
Height: 68 in
Weight: 4151.7 [oz_av]

## 2022-04-20 LAB — MAGNESIUM: Magnesium: 2.8 mg/dL — ABNORMAL HIGH (ref 1.7–2.4)

## 2022-04-20 LAB — PHOSPHORUS: Phosphorus: 5.4 mg/dL — ABNORMAL HIGH (ref 2.5–4.6)

## 2022-04-20 LAB — LACTATE DEHYDROGENASE: LDH: 1824 U/L — ABNORMAL HIGH (ref 98–192)

## 2022-04-20 MED ORDER — CLONAZEPAM 0.5 MG PO TABS
0.5000 mg | ORAL_TABLET | Freq: Two times a day (BID) | ORAL | Status: DC
Start: 2022-04-20 — End: 2022-04-22
  Administered 2022-04-20 – 2022-04-21 (×3): 0.5 mg
  Filled 2022-04-20 (×3): qty 1

## 2022-04-20 MED ORDER — PROSOURCE TF PO LIQD
90.0000 mL | Freq: Four times a day (QID) | ORAL | Status: DC
  Administered 2022-04-20 – 2022-04-21 (×3): 90 mL
  Filled 2022-04-20 (×3): qty 90

## 2022-04-20 MED ORDER — PIVOT 1.5 CAL PO LIQD
1000.0000 mL | ORAL | Status: DC
  Administered 2022-04-20: 1000 mL

## 2022-04-20 MED ORDER — CEFAZOLIN SODIUM-DEXTROSE 2-4 GM/100ML-% IV SOLN
2.0000 g | INTRAVENOUS | Status: DC
  Filled 2022-04-20: qty 100

## 2022-04-20 MED ORDER — OXYCODONE HCL 5 MG PO TABS
5.0000 mg | ORAL_TABLET | Freq: Four times a day (QID) | ORAL | Status: DC
  Administered 2022-04-20 (×3): 5 mg via ORAL
  Filled 2022-04-20 (×3): qty 1

## 2022-04-20 MED ORDER — OXYCODONE HCL 5 MG PO TABS
5.0000 mg | ORAL_TABLET | Freq: Four times a day (QID) | ORAL | Status: DC
  Administered 2022-04-21 (×2): 5 mg
  Filled 2022-04-20 (×2): qty 1

## 2022-04-20 MED ORDER — PRISMASOL BGK 0/2.5 32-2.5 MEQ/L EC SOLN
Status: DC
  Filled 2022-04-20 (×8): qty 5000

## 2022-04-20 MED ORDER — VANCOMYCIN HCL 2000 MG/400ML IV SOLN
2000.0000 mg | Freq: Once | INTRAVENOUS | Status: AC
  Administered 2022-04-20: 2000 mg via INTRAVENOUS
  Filled 2022-04-20: qty 400

## 2022-04-20 MED ORDER — VANCOMYCIN HCL 1250 MG/250ML IV SOLN
1250.0000 mg | INTRAVENOUS | Status: DC
  Filled 2022-04-20: qty 250

## 2022-04-20 MED ORDER — MIDODRINE HCL 5 MG PO TABS
10.0000 mg | ORAL_TABLET | Freq: Three times a day (TID) | ORAL | Status: DC
  Administered 2022-04-20 – 2022-04-21 (×3): 10 mg
  Filled 2022-04-20 (×3): qty 2

## 2022-04-20 MED ORDER — QUETIAPINE FUMARATE 50 MG PO TABS
50.0000 mg | ORAL_TABLET | Freq: Two times a day (BID) | ORAL | Status: DC
  Administered 2022-04-20 – 2022-04-21 (×3): 50 mg
  Filled 2022-04-20 (×3): qty 1

## 2022-04-20 NOTE — Progress Notes (Signed)
ANTICOAGULATION CONSULT NOTE  Pharmacy Consult for bivalirudin Indication:  ECMO + Impella  No Known Allergies  Patient Measurements: Height: 5\' 8"  (172.7 cm) Weight: 117.7 kg (259 lb 7.7 oz) IBW/kg (Calculated) : 68.4 Heparin Dosing Weight: 93kg  Vital Signs: Temp: 97.2 F (36.2 C) (07/13 1630) Temp Source: Bladder (07/13 1600) BP: 82/63 (07/13 1600) Pulse Rate: 80 (07/13 1523)  Labs: Recent Labs    05/06/2022 1741 04/29/2022 1812 05/02/2022 2106 04/10/2022 2107 04/19/2022 2301 04/21/2022 2302 04/18/22 0300 04/18/22 0305 04/18/22 0307 04/19/22 0300 04/19/22 0306 04/19/22 1123 04/19/22 1125 04/19/22 1609 04/19/22 1617 04/20/22 0355 04/20/22 0357 04/20/22 1603 04/20/22 1611  HGB 9.8*   < > 10.9*   < >  --    < > 10.8*  --    < > 8.7*   < > 8.3*   < > 8.3*   < > 8.0* 7.8*  --  8.2*  HCT 29.4*   < > 32.6*   < >  --    < > 31.4*  --    < > 25.1*   < > 24.8*   < > 24.7*   < > 24.5* 23.0*  --  24.0*  PLT 162  --  174  --   --   --  141*  --    < > 96*  --  96*  --  91*  --  89*  --   --   --   APTT >200*  --  >200*  --  >200*  --  81*  --    < > 57*  --   --   --  60*  --  59*  --  63*  --   LABPROT 19.2*  --   --   --   --   --  15.7*  --   --  16.7*  --   --   --   --   --  17.1*  --   --   --   INR 1.6*  --   --   --   --   --  1.3*  --   --  1.4*  --   --   --   --   --  1.4*  --   --   --   HEPARINUNFRC >1.10*  --   --   --  >1.10*  --   --  0.30  --   --   --   --   --   --   --   --   --   --   --   CREATININE DUPLICATE REQUEST  2.52*  --   --   --   --    < > 2.44*  --    < > 2.42*  --  2.32*  --  2.49*  2.42*  --  2.63*  --   --   --   TROPONINIHS  --   --  >24,000*  --   --   --   --   --   --   --   --   --   --   --   --   --   --   --   --    < > = values in this interval not displayed.     Estimated Creatinine Clearance: 36.8 mL/min (A) (by C-G formula based on SCr of 2.63 mg/dL (H)).   Assessment: 68 yoM admitted with CHB  and cardiogenic shock s/p Impella CP  and ECMO cannulation. Pt is on bivalirudin for anticoagulation. Will target aPTT 60-80 seconds with Impella placed as well.  aPTT therapeutic at 63 seconds; no further oozing per RN.   Goal of Therapy:  aPTT 60-80 seconds Monitor platelets by anticoagulation protocol: Yes   Plan:  Continue bivalirudin at 0.032 mg/kg/hr Continue bicarbonate purge  Check q12h aPTT - 5a/5p  Kenyen Candy D. Laney Potash, PharmD, BCPS, BCCCP 04/20/2022, 4:46 PM

## 2022-04-20 NOTE — Progress Notes (Signed)
[facility] Progress Note   Subjective:   Remains critically ill on ecmo.  Discussed with RN.  I/Os 3500 / 5600 - UOP 72mL, UF 5.5L   Objective Vitals:   04/20/22 0815 04/20/22 0830 04/20/22 0845 04/20/22 0900  BP:      Pulse:      Resp: 16 16 16 16   Temp: (!) 97.3 F (36.3 C) (!) 97.3 F (36.3 C) (!) 97.3 F (36.3 C) (!) 97.3 F (36.3 C)  TempSrc:      SpO2:      Weight:      Height:       Physical Exam General: intubated, sedated.  Heart: RRR Lungs: coarse Abdomen: soft, mod distended Extremities: trace diffuse edema  Additional Objective Labs: Basic Metabolic Panel: Recent Labs  Lab 04/19/22 1123 04/19/22 1125 04/19/22 1609 04/19/22 1617 04/19/22 2339 04/20/22 0355 04/20/22 0357  NA 134*   < > 136  137   < > 136 138 136  K 5.7*   < > 5.4*  5.4*   < > 5.5* 5.8* 5.8*  CL 105  --  103  105  --   --  105  --   CO2 23  --  24  24  --   --  26  --   GLUCOSE 169*  --  162*  164*  --   --  140*  --   BUN 14  --  13  13  --   --  14  --   CREATININE 2.32*  --  2.49*  2.42*  --   --  2.63*  --   CALCIUM 7.0*  --  7.2*  7.2*  --   --  7.3*  --   PHOS 3.8  --  4.4  --   --  5.4*  --    < > = values in this interval not displayed.    Liver Function Tests: Recent Labs  Lab 04/18/22 0300 04/18/22 1521 04/19/22 0300 04/19/22 1123 04/19/22 1609 04/20/22 0355  AST 1,324*  --  900*  --   --  632*  ALT 428*  --  328*  --   --  295*  ALKPHOS 36*  --  40  --   --  49  BILITOT 0.3  --  0.4  --   --  0.5  PROT 4.2*  --  4.4*  --   --  5.3*  ALBUMIN 2.5*   < > 2.6* 2.7* 2.7* 2.7*   < > = values in this interval not displayed.    No results for input(s): "LIPASE", "AMYLASE" in the last 168 hours. CBC: Recent Labs  Lab 04/21/2022 0730 04/11/2022 1020 04/18/22 1521 04/18/22 1643 04/19/22 0300 04/19/22 0306 04/19/22 1123 04/19/22 1125 04/19/22 1609 04/19/22 1617 04/19/22 2339 04/20/22 0355 04/20/22 0357  WBC 10.2   < > 8.1  --  10.5   --  9.5  --  10.1  --   --  9.9  --   NEUTROABS 5.4  --   --   --   --   --   --   --   --   --   --   --   --   HGB 13.0   < > 9.4*  8.8*   < > 8.7*   < > 8.3*   < > 8.3*   < > 7.5* 8.0* 7.8*  HCT 38.2*   < > 26.8*  26.0*   < >  25.1*   < > 24.8*   < > 24.7*   < > 22.0* 24.5* 23.0*  MCV 92.7   < > 86.5  --  87.2  --  89.2  --  89.5  --   --  92.1  --   PLT 276   < > 115*  --  96*  --  96*  --  91*  --   --  89*  --    < > = values in this interval not displayed.    Blood Culture No results found for: "SDES", "SPECREQUEST", "CULT", "REPTSTATUS"  Cardiac Enzymes: No results for input(s): "CKTOTAL", "CKMB", "CKMBINDEX", "TROPONINI" in the last 168 hours. CBG: Recent Labs  Lab 04/19/22 1614 04/19/22 1924 04/19/22 2337 04/20/22 0354 04/20/22 0808  GLUCAP 158* 158* 142* 139* 119*    Iron Studies: No results for input(s): "IRON", "TIBC", "TRANSFERRIN", "FERRITIN" in the last 72 hours. @lablastinr3 @ Studies/Results: DG CHEST PORT 1 VIEW  Result Date: 04/20/2022 CLINICAL DATA:  Chest tube placement.  ECMO EXAM: PORTABLE CHEST 1 VIEW COMPARISON:  04/19/2022 FINDINGS: Endotracheal tube unchanged. Transjugular catheter in the RIGHT heart. Two LEFT central venous lines in the SVC. Aortic Impella device noted. Widened mediastinum similar prior. Lungs are clear. Low lung volumes. IMPRESSION: 1. Stable support apparatus. 2. No pulmonary edema or pneumothorax. 3. Prominent mediastinal width unchanged. Electronically Signed   By: 06/20/2022 M.D.   On: 04/20/2022 08:23   DG Abd Portable 1V  Result Date: 04/19/2022 CLINICAL DATA:  Feeding tube placement. EXAM: PORTABLE ABDOMEN - 1 VIEW COMPARISON:  None Available. FINDINGS: The feeding tube tip is in the antropyloric region of the stomach. IMPRESSION: Feeding tube tip is in the antropyloric region of the stomach. Electronically Signed   By: 06/20/2022 M.D.   On: 04/19/2022 11:23   DG CHEST PORT 1 VIEW  Result Date: 04/19/2022 CLINICAL  DATA:  06/20/2022 congestive heart failure, on ECMO, follow-up study EXAM: PORTABLE CHEST 1 VIEW COMPARISON:  April 18, 2022 FINDINGS: Again seen are the endotracheal tube, NG tube, left transjugular central venous catheter with its tip at the junction of the left brachiocephalic vein and SVC, left subclavian double-lumen central venous catheter with its tip at the upper SVC. Right transjugular catheter with its tip appears to be in the right ventricle. There is an electrical pad overlying the left lower thorax, obscuring the left lung base stable. Cardiomediastinal silhouette is prominent. Low lung volumes. Moderate pulmonary vascular congestion and has mildly improved in the interim. Likely mild bilateral pleural effusion. The visualized skeletal structures show moderate thoracic spondylosis. IMPRESSION: 1. There has been no significant interval change in the supporting tubes. 2.  Cardiomediastinal silhouette is prominent. 3. Moderate pulmonary vascular congestion and has improved in the interim. Low lung volumes. Electronically Signed   By: April 20, 2022 M.D.   On: 04/19/2022 08:35   ECHOCARDIOGRAM LIMITED  Result Date: 04/18/2022    ECHOCARDIOGRAM LIMITED REPORT   Patient Name:   KAHLI FITZGERALD Heffley Date of Exam: 04/18/2022 Medical Rec #:  06/19/2022     Height:       68.0 in Accession #:    [id number]    Weight:       282.2 lb Date of Birth:  11-21-1960     BSA:          2.366 m Patient Age:    61 years      BP:           67/61  mmHg Patient Gender: M             HR:           80 bpm. Exam Location:  Inpatient Procedure: Limited Echo and Intracardiac Opacification Agent Indications:    CHF  History:        Patient has prior history of Echocardiogram examinations, most                 recent 04/12/2022. CAD, Cardiac cath 04/17/21; Risk                 Factors:Hypertension, Diabetes and Dyslipidemia. Cardiac arrest.                 Renal failure on bedside dialysis. Respiratory failure on ECMO.  Sonographer:    Roosvelt Maser  RDCS Referring Phys: [id number] DALTON S MCLEAN IMPRESSIONS  1. Left ventricular ejection fraction, by estimation, is 40 to 45%. The left ventricle has mildly decreased function. Comparison(s): No significant change from prior study. Prior images reviewed side by side. Definity contrast was used. No Doppler images were performed. FINDINGS  Left Ventricle: No left ventricular thrombus is seen. Impella well poitioned across the aortic valve. Left ventricular ejection fraction, by estimation, is 40 to 45%. The left ventricle has mildly decreased function. Definity contrast agent was given IV  to delineate the left ventricular endocardial borders.  LV Wall Scoring: The antero-lateral wall and posterior wall are akinetic. The entire anterior wall, entire septum, entire apex, and entire inferior wall are normal. Pericardium: There is no evidence of pericardial effusion. Thurmon Fair MD Electronically signed by Thurmon Fair MD Signature Date/Time: 04/18/2022/2:11:38 PM    Final    Medications:  sodium chloride Stopped (05/05/2022 1846)   albumin human Stopped (04/18/22 2028)   bivalirudin (ANGIOMAX) 250 mg in sodium chloride 0.9 % 500 mL (0.5 mg/mL) infusion 0.032 mg/kg/hr (04/20/22 1000)   epinephrine 4 mcg/min (04/20/22 1000)   feeding supplement (PIVOT 1.5 CAL) 20 mL/hr at 04/20/22 1000   fentaNYL infusion INTRAVENOUS 275 mcg/hr (04/20/22 1000)   meropenem (MERREM) IV Stopped (04/20/22 4034)   midazolam 7 mg/hr (04/20/22 1000)   norepinephrine (LEVOPHED) Adult infusion 18 mcg/min (04/20/22 1000)   prismasol BGK 2/2.5 dialysis solution 2,000 mL/hr at 04/20/22 0812   prismasol BGK 2/2.5 replacement solution 400 mL/hr at 04/20/22 0825   prismasol BGK 2/2.5 replacement solution 400 mL/hr at 04/20/22 0824   sodium bicarbonate 25 mEq (Impella PURGE) in dextrose 5 % 1000 mL bag     vasopressin 0.03 Units/min (04/20/22 1000)    amiodarone  200 mg Per Tube BID   aspirin  81 mg Per Tube Daily   atorvastatin  80 mg  Per Tube Daily   Chlorhexidine Gluconate Cloth  6 each Topical Daily   clonazePAM  0.5 mg Per Tube BID   insulin aspart  3-9 Units Subcutaneous Q4H   insulin detemir  20 Units Subcutaneous BID   ipratropium-albuterol  3 mL Nebulization Q4H   midodrine  10 mg Per Tube Q8H   multivitamin  1 tablet Per Tube QHS   mouth rinse  15 mL Mouth Rinse Q2H   oxyCODONE  5 mg Oral Q6H   pantoprazole (PROTONIX) IV  40 mg Intravenous QHS   QUEtiapine  50 mg Per Tube BID   sodium chloride flush  10-40 mL Intracatheter Q12H   sodium chloride flush  3 mL Intravenous Q12H   ticagrelor  90 mg Per Tube BID    Assessment/Plan:   #  AKI in the setting of cardiogenic shock - CRRT orders are in place  - Tolerating UF currently - 2L net neg yesterday, continue today  #hyperkalemia:  persistent, change to all 2K fluids today and increase DFR.  Afternoon labs.    # PEA arrest - post-arrest supportive care per cardiology and critical care   # Complete heart block  - s/p temp pacer   # Cardiogenic shock  - pressors/inotropes per critical care    # Anemia - acute blood loss  - s/p PRBC's and had bled out from the site of ECMO cannulation.  - PRBC's per primary team    # Acute MI - s/p PCI with cardiology on 7/10   # Acute hypoxic respiratory failure - on vent per critical care and on ecmo   Jannifer Hick MD 04/20/2022, 10:15 AM  [place] Pager: [phone]

## 2022-04-20 NOTE — Progress Notes (Signed)
  Echocardiogram 2D Echocardiogram has been performed.  Augustine Radar 04/20/2022, 1:24 PM

## 2022-04-20 NOTE — Progress Notes (Addendum)
  Progress Note    04/20/2022 7:41 AM 3 Days Post-Op  Subjective:  intubated and sedated   Vitals:   04/20/22 0600 04/20/22 0700  BP:    Pulse:    Resp: 16 16  Temp: (!) 97.5 F (36.4 C) (!) 97.5 F (36.4 C)  SpO2:     Physical Exam: Lungs:  mechanical ventilation Incisions:  L groin with some sanguinous oozing but no firm hematoma Extremities:  feet cool to touch Abdomen:  soft, ND Neurologic: sedated  CBC    Component Value Date/Time   WBC 9.9 04/20/2022 0355   RBC 2.66 (L) 04/20/2022 0355   HGB 7.8 (L) 04/20/2022 0357   HCT 23.0 (L) 04/20/2022 0357   PLT 89 (L) 04/20/2022 0355   MCV 92.1 04/20/2022 0355   MCH 30.1 04/20/2022 0355   MCHC 32.7 04/20/2022 0355   RDW 16.8 (H) 04/20/2022 0355   LYMPHSABS 3.6 04/16/2022 0730   MONOABS 0.9 04/15/2022 0730   EOSABS 0.2 05/04/2022 0730   BASOSABS 0.1 04/15/2022 0730    BMET    Component Value Date/Time   NA 136 04/20/2022 0357   K 5.8 (H) 04/20/2022 0357   CL 105 04/20/2022 0355   CO2 26 04/20/2022 0355   GLUCOSE 140 (H) 04/20/2022 0355   BUN 14 04/20/2022 0355   CREATININE 2.63 (H) 04/20/2022 0355   CALCIUM 7.3 (L) 04/20/2022 0355   GFRNONAA 27 (L) 04/20/2022 0355    INR    Component Value Date/Time   INR 1.4 (H) 04/20/2022 0355     Intake/Output Summary (Last 24 hours) at 04/20/2022 0741 Last data filed at 04/20/2022 0700 Gross per 24 hour  Intake 3503.79 ml  Output 5593 ml  Net -2089.21 ml     Assessment/Plan:  61 y.o. male with bleeding around ECMO cannula site Monday evening  Minimal bleeding from L groin ECMO site this morning but no firm hematoma; monitor H&H. Tentative plan for ECMO decan with repair of L CFA tomorrow 7/14   Emilie Rutter, PA-C [facility] [phone] 04/20/2022 7:41 AM  I have seen and evaluated the patient. I agree with the PA note as documented above.  Patient remains intubated and critically ill in the ICU on ECMO.  Remains on levophed,  epinehprine, vasopressin.   Overall is clinically improving.  I discussed with critical care who states plans for ECMO decannulation for tomorrow are still on schedule.  He is scheduled tomorrow for removal of the left femoral artery ECMO cannula with repair of the artery and also removal of the antegrade SFA sheath in the left leg.  Please keep n.p.o. after midnight.  He is posted tomorrow for my partner Dr. Randie Heinz.  Cephus Shelling, MD [facility] [place] Office: [phone]

## 2022-04-20 NOTE — Progress Notes (Signed)
Patient ID: James White, male   DOB: 1961/04/23, 61 y.o.   MRN: [id number]     Advanced Heart Failure Rounding Note  PCP-Cardiologist: None   Subjective:    7/10: LAD PCI.  Temporary wire placement.  Impella placed. VA ECMO cannulation.   Stable MAP on NE 13, epinephrine 4, vasopressin 0.03.  CVVH running, weight down with I/Os net negative 2089 cc,  CVP 18-19.  CXR improving.   Cefepime coverage for aspiration PNA.   Hgb 8, mildly lower.  Left groin site stable without further overt bleeding.   Sedation with Versed/Fentanyl. Patient will move extremities.   NSR on amiodarone po, no further VT.   ECMO: 3600 rpm Flow 3.91 Pven -76 DeltaP 21 ABG 7.40/47/312, O2 sat 99% LDH 1992 => 1902 => 1824 Lactate >9 => 7.8 => 3 => 3.5 => 1.7 Vent FiO2 0.5 Bivalirudin gtt with goal PTT 60-80  Impella CP: P1  Flow 0.9 L/min   Objective:   Weight Range: 117.7 kg Body mass index is 39.45 kg/m.   Vital Signs:   Temp:  [97.2 F (36.2 C)-97.7 F (36.5 C)] 97.5 F (36.4 C) (07/13 0700) Pulse Rate:  [80] 80 (07/13 0326) Resp:  [14-19] 16 (07/13 0700) BP: (77-102)/(19-81) 99/81 (07/13 0400) SpO2:  [100 %] 100 % (07/13 0326) Arterial Line BP: (68-95)/(57-78) 84/68 (07/13 0700) FiO2 (%):  [50 %] 50 % (07/13 0326) Weight:  [117.7 kg] 117.7 kg (07/13 0500) Last BM Date :  (PTA)  Weight change: Filed Weights   04/18/22 0500 04/19/22 0500 04/20/22 0500  Weight: 128 kg 127.8 kg 117.7 kg    Intake/Output:   Intake/Output Summary (Last 24 hours) at 04/20/2022 0745 Last data filed at 04/20/2022 0700 Gross per 24 hour  Intake 3503.79 ml  Output 5593 ml  Net -2089.21 ml      Physical Exam    General: sedated on vent Neck: Thick, JVP 16 cm, no thyromegaly or thyroid nodule.  Lungs: Clear to auscultation bilaterally with normal respiratory effort. CV: Nondisplaced PMI.  Heart regular S1/S2, no S3/S4, no murmur.  1+ ankle edema.  Abdomen: Soft, nontender, no hepatosplenomegaly,  no distention.  Skin: Intact without lesions or rashes.  Neurologic: Will move all extremities.  Extremities: No clubbing or cyanosis.  HEENT: Normal.   Telemetry   V-paced rate 80, personally reviewed.   Labs    CBC Recent Labs    04/19/22 1609 04/19/22 1617 04/20/22 0355 04/20/22 0357  WBC 10.1  --  9.9  --   HGB 8.3*   < > 8.0* 7.8*  HCT 24.7*   < > 24.5* 23.0*  MCV 89.5  --  92.1  --   PLT 91*  --  89*  --    < > = values in this interval not displayed.   Basic Metabolic Panel Recent Labs    04/19/22 0300 04/19/22 0306 04/19/22 1609 04/19/22 1617 04/20/22 0355 04/20/22 0357  NA 136   < > 136  137   < > 138 136  K 6.2*   < > 5.4*  5.4*   < > 5.8* 5.8*  CL 104   < > 103  105  --  105  --   CO2 23   < > 24  24  --  26  --   GLUCOSE 175*   < > 162*  164*  --  140*  --   BUN 12   < > 13  13  --  14  --   CREATININE 2.42*   < > 2.49*  2.42*  --  2.63*  --   CALCIUM 6.7*   < > 7.2*  7.2*  --  7.3*  --   MG 2.6*  --   --   --  2.8*  --   PHOS 3.4   < > 4.4  --  5.4*  --    < > = values in this interval not displayed.   Liver Function Tests Recent Labs    04/19/22 0300 04/19/22 1123 04/19/22 1609 04/20/22 0355  AST 900*  --   --  632*  ALT 328*  --   --  295*  ALKPHOS 40  --   --  49  BILITOT 0.4  --   --  0.5  PROT 4.4*  --   --  5.3*  ALBUMIN 2.6*   < > 2.7* 2.7*   < > = values in this interval not displayed.   No results for input(s): "LIPASE", "AMYLASE" in the last 72 hours. Cardiac Enzymes No results for input(s): "CKTOTAL", "CKMB", "CKMBINDEX", "TROPONINI" in the last 72 hours.  BNP: BNP (last 3 results) No results for input(s): "BNP" in the last 8760 hours.  ProBNP (last 3 results) No results for input(s): "PROBNP" in the last 8760 hours.   D-Dimer No results for input(s): "DDIMER" in the last 72 hours. Hemoglobin A1C Recent Labs    04/18/22 0300  HGBA1C 7.1*   Fasting Lipid Panel Recent Labs    04/18/22 0300  CHOL 149   HDL 25*  LDLCALC 86  TRIG 191*  CHOLHDL 6.0   Thyroid Function Tests No results for input(s): "TSH", "T4TOTAL", "T3FREE", "THYROIDAB" in the last 72 hours.  Invalid input(s): "FREET3"  Other results:   Imaging    DG Abd Portable 1V  Result Date: 04/19/2022 CLINICAL DATA:  Feeding tube placement. EXAM: PORTABLE ABDOMEN - 1 VIEW COMPARISON:  None Available. FINDINGS: The feeding tube tip is in the antropyloric region of the stomach. IMPRESSION: Feeding tube tip is in the antropyloric region of the stomach. Electronically Signed   By: Marijo Sanes M.D.   On: 04/19/2022 11:23     Medications:     Scheduled Medications:  amiodarone  200 mg Per Tube BID   aspirin  81 mg Per Tube Daily   atorvastatin  80 mg Per Tube Daily   Chlorhexidine Gluconate Cloth  6 each Topical Daily   insulin aspart  3-9 Units Subcutaneous Q4H   insulin detemir  20 Units Subcutaneous BID   ipratropium-albuterol  3 mL Nebulization Q4H   midodrine  5 mg Per Tube Q8H   multivitamin  1 tablet Per Tube QHS   mouth rinse  15 mL Mouth Rinse Q2H   pantoprazole (PROTONIX) IV  40 mg Intravenous QHS   sodium chloride flush  10-40 mL Intracatheter Q12H   sodium chloride flush  3 mL Intravenous Q12H   ticagrelor  90 mg Per Tube BID    Infusions:   prismasol BGK 4/2.5 400 mL/hr at 04/19/22 1813    prismasol BGK 4/2.5 400 mL/hr at 04/20/22 0202   sodium chloride Stopped (04/29/2022 1846)   albumin human Stopped (04/18/22 2028)   bivalirudin (ANGIOMAX) 250 mg in sodium chloride 0.9 % 500 mL (0.5 mg/mL) infusion 0.03 mg/kg/hr (04/20/22 0700)   epinephrine 4 mcg/min (04/20/22 0700)   feeding supplement (PIVOT 1.5 CAL) 20 mL/hr at 04/20/22 0700   fentaNYL infusion INTRAVENOUS 275 mcg/hr (04/20/22  0700)   meropenem (MERREM) IV Stopped (04/20/22 6045)   midazolam 7 mg/hr (04/20/22 0700)   norepinephrine (LEVOPHED) Adult infusion 13 mcg/min (04/20/22 0700)   prismasol BGK 2/2.5 dialysis solution 1,500 mL/hr at  04/20/22 0255   sodium bicarbonate 25 mEq (Impella PURGE) in dextrose 5 % 1000 mL bag     vasopressin 0.03 Units/min (04/20/22 0548)    PRN Medications: sodium chloride, acetaminophen, albumin human, fentaNYL, heparin, midazolam, midazolam, midazolam, ondansetron (ZOFRAN) IV, mouth rinse, sodium chloride    Assessment/Plan   1. Cardiogenic shock: Ischemic cardiomyopathy.  After PCI to LM/LAD, Impella, and VA ECMO, the LV EF appeared to be around 40% with mild RV systolic dysfunction.  Echo on 7/11 with EF around 40%.  Patient is currently on epinephrine 4, norepinephrine 13, vasopressin 0.03.  Impella for LV vent at P1 with flow 0.9 L/min.  VA ECMO ongoing, stable overnight. Lactate down to 1.7.  CVVH ongoing for fluid removal, CVP 18-19. CXR improved. Weight down.  - Continue fluid removal via CVVH.     - Wean pressors as able. Increase midodrine to 10 mg tid.  - Continue Impella P1 - Continue VA ECMO. Will echo him today and do trial wean down of ECMO flow and up on Impella flow.  Will aim for possible decannulation tomorrow after more fluid is off. Will need OR for vascular to removal arterial cannula, may need Impella 5.5 placed.  - Continue bivalirudin gtt for ECMO and Impella, goal PTT 60-80.  2. CAD: Presented with posterior MI and occluded ostial LCx as likely culprit.  Unable to wire the LCx.  Up to 95% stenosis left main into LAD, DES LM to proximal LAD.   - Continue ticagrelor.  - statin.  - ASA 81.  3. VT arrest: In setting of bleeding from left groin.  DCCV x 1 with no CPR.  - Amiodarone po.   4. PEA arrest: Post-PCI.  Required about 2 minutes CPR then resolved with epinephrine/HCO3.  5. Acute hypoxemic respiratory failure: Volume overload/pulmonary edema.  Now managed by VA ECMO.  CXR with pulmonary edema, improved today.  Vent FiO2 0.5 with sweep down to 1, good gas exchange.  CCM following.  - Vent per CCM.  - Needs volume removed via CVVH, see above.  - Covering for  possible aspiration PNA with cefepime.  6. Arterial cannula site bleeding: Bolsters sutured in place, bleeding stopped.  VVS following.  7. Anemia: Hgb 8 this morning.  Transfuse to keep hgb > 8. Repeat CBC in pm.  8. AKI on ?CKD 3: Creatinine 1.6 at admission, unsure of baseline.  Contrast and hypotension/shock. CVVH started on 7/10.  - Will need CVVH as above.  9. Thrombocytopenia: Critical illness and blood products.  Mild so far (plts 89).  - Continue bivalirudin.  10. Type 2 diabetes: Insulin gtt.  11. Complete heart block: Has temporary transvenous pacemaker now, likely related to posterior MI.  Currently pacing rate 80. May need PPM.  12. FEN: TFs via [place].  13. Elevated LFTs: Suspect shock liver, decreasing.  14. Neuro: Still sedated but will move all extremities.   CRITICAL CARE Performed by: Marca Ancona  Total critical care time: 45 minutes  Critical care time was exclusive of separately billable procedures and treating other patients.  Critical care was necessary to treat or prevent imminent or life-threatening deterioration.  Critical care was time spent personally by me on the following activities: development of treatment plan with patient and/or surrogate as well as  nursing, discussions with consultants, evaluation of patient's response to treatment, examination of patient, obtaining history from patient or surrogate, ordering and performing treatments and interventions, ordering and review of laboratory studies, ordering and review of radiographic studies, pulse oximetry and re-evaluation of patient's condition.     Length of Stay: 3  Marca Ancona, MD  04/20/2022, 7:45 AM  Advanced Heart Failure Team Pager [phone] (M-F; 7a - 5p)  Please contact [facility] for night-coverage after hours (5p -7a ) and weekends on [email or web address]

## 2022-04-20 NOTE — Progress Notes (Addendum)
CSW received consult.Due to patients current orientation CSW spoke with patients sister Angelique Blonder. Patients sister had some questions for CSW.. All questions answered. No further questions reported at this time.

## 2022-04-20 NOTE — Progress Notes (Addendum)
ANTICOAGULATION CONSULT NOTE  Pharmacy Consult for bivalirudin Indication:  ECMO + Impella  No Known Allergies  Patient Measurements: Height: 5\' 8"  (172.7 cm) Weight: 117.7 kg (259 lb 7.7 oz) IBW/kg (Calculated) : 68.4 Heparin Dosing Weight: 93kg  Vital Signs: Temp: 97.5 F (36.4 C) (07/13 0700) Temp Source: Bladder (07/13 0000) BP: 99/81 (07/13 0400) Pulse Rate: 80 (07/13 0326)  Labs: Recent Labs    04/13/2022 0934 04/26/2022 1020 04/15/2022 1741 04/16/2022 1812 04/24/2022 2106 04/16/2022 2107 05/02/2022 2301 04/15/2022 2302 04/18/22 0300 04/18/22 0305 04/18/22 0307 04/19/22 0300 04/19/22 0306 04/19/22 1123 04/19/22 1125 04/19/22 1609 04/19/22 1617 04/19/22 2339 04/20/22 0355 04/20/22 0357  HGB  --    < > 9.8*   < > 10.9*   < >  --    < > 10.8*  --    < > 8.7*   < > 8.3*   < > 8.3*   < > 7.5* 8.0* 7.8*  HCT  --    < > 29.4*   < > 32.6*   < >  --    < > 31.4*  --    < > 25.1*   < > 24.8*   < > 24.7*   < > 22.0* 24.5* 23.0*  PLT  --    < > 162  --  174  --   --   --  141*  --    < > 96*  --  96*  --  91*  --   --  89*  --   APTT  --    < > >200*  --  >200*  --  >200*  --  81*  --    < > 57*  --   --   --  60*  --   --  59*  --   LABPROT 12.6  --  19.2*  --   --   --   --   --  15.7*  --   --  16.7*  --   --   --   --   --   --  17.1*  --   INR 1.0  --  1.6*  --   --   --   --   --  1.3*  --   --  1.4*  --   --   --   --   --   --  1.4*  --   HEPARINUNFRC  --   --  >1.10*  --   --   --  >1.10*  --   --  0.30  --   --   --   --   --   --   --   --   --   --   CREATININE  --    < > DUPLICATE REQUEST  2.52*  --   --   --   --    < > 2.44*  --    < > 2.42*  --  2.32*  --  2.49*  2.42*  --   --  2.63*  --   TROPONINIHS 1,198*  --   --   --  >24,000*  --   --   --   --   --   --   --   --   --   --   --   --   --   --   --    < > = values in this interval  not displayed.     Estimated Creatinine Clearance: 36.8 mL/min (A) (by C-G formula based on SCr of 2.63 mg/dL (H)).   Medical  History: Past Medical History:  Diagnosis Date   High cholesterol    Hypertension      Assessment: 67 yoM admitted with CHB and cardiogenic shock s/p Impella CP and ECMO cannulation. Pt is on bivalirudin for anticoagulation. Will target aPTT 60-80 seconds with Impella placed as well.  aPTT is slightly below goal at 59 seconds. Mild oozing overnight, CBC stable.  Goal of Therapy:  aPTT 60-80 seconds Monitor platelets by anticoagulation protocol: Yes   Plan:  Increase bivalirudin to 0.032 mg/kg/hr Continue bicarbonate purge  Check q12h aPTT - 5a/5p   Fredonia Highland, PharmD, BCPS, [facility] Clinical Pharmacist [phone] Please check AMION for all [facility] numbers 04/20/2022

## 2022-04-20 NOTE — Progress Notes (Signed)
Patient ID: James White, male   DOB: [birth year], 61 y.o.   MRN: [id number] Extracorporeal support note   ECLS support day: 3 Indication: Cardiogenic shock  Configuration: VA ECMO. Distal perfusion right and left SFAs.   Drainage cannula: R femoral vein 23 F Return cannula: L femoral artery 19 F  Pump speed: 3600 rpm Pump flow: 3.91 L/min Cardiohelp  Sweep gas: 1  Circuit check: Stable Anticoagulant: bivalirudin Anticoagulation targets: PTT 60-80  Changes in support: Goal fluid removal. Echo today, trial of decrease ECMO speed/increase Impella speed by echo guidance.  Anticipated goals/duration of support: Wean off  Marca Ancona MD 04/20/2022, 7:43 AM

## 2022-04-20 NOTE — Consult Note (Signed)
[address]            [place] [address]          [phone]       AIDENN SKELLENGER [facility] Medical Record #[id number] Date of Birth: [birth year]  No ref. provider found Dr. Tressie Stalker, Nicki [place], PA-C (Inactive)  Chief Complaint:    Chief Complaint  Patient presents with   Loss of Consciousness  Patient examined, images of echocardiogram, coronary angiogram and portable chest x-ray personally reviewed.  Patient's condition and plan of care discussed with Dr. Shirlee Latch for coordination of care  History of Present Illness:     61 year old previously healthy male presented with acute MI/STEMI and cardiogenic shock.  He underwent emergency cardiac cath with findings of an occluded circumflex not revascularized and subsequent PCI of the left main and LAD and placement of Impella CP assist device.  He sustained cardiac arrest requiring CPR and was placed on [place] ECMO emergently with arterial cannulation left femoral artery venous cannulation right femoral vein with Impella CP previously placed in the left femoral artery.  He has shown improvement in hemodynamics and pulmonary function over the past 48 hours and has been stable during a careful ECMO wean.  Decannulation is planned tomorrow and his cardiologist has recommended transition to an Impella 5.5 temporary LV assist device for treatment of the cardiogenic shock.   The patient is currently systemically anticoagulated with bivalirudin 0.03 mcg/kg/min with PTT goal 60-80 seconds.  His hematocrit is 7.8 and platelet count 89,000.  Chest x-ray is fairly clear and he is on IV antibiotics for possible aspiration pneumonia.  Most recent echocardiogram shows EF 40 to 45% with good function of the inferior and anterior wall, akinesia of the posterior wall. The patient is on Brilinta tablet 90 mg for the LAD and left main PCI Patient is on low-dose epinephrine, moderate dose norepinephrine and vasopressin.  CVP is  14.  RV function on his post ECMO echoes has been with mild to moderate dysfunction.  Right coronary had a 80% proximal stenosis not treated. Patient is sedated on IV fentanyl and Versed but moves all extremities but no purposeful movement.  The patient is felt to be stable for decannulation for ECMO with transition to an Impella 5.5 LVAD.  The Impella CP bed via the right femoral artery has been used as an LV vent and will be removed with the ECMO cannulas.  Current Activity/ Functional Status: Zubrod Score: At the time of surgery this patient's most appropriate activity status/level should be described as: []     0    Normal activity, no symptoms []     1    Restricted in physical strenuous activity but ambulatory, able to do out light work []     2    Ambulatory and capable of self care, unable to do work activities, up and about >50 % of waking hours                              []     3    Only limited self care, in bed greater than 50% of waking hours [x]     4    Completely disabled, no self care, confined to bed or chair []     5    Moribund    Past Medical History:  Diagnosis Date   High cholesterol    Hypertension     Past Surgical History:  Procedure Laterality Date   CORONARY STENT INTERVENTION W/IMPELLA N/A 04/23/2022   Procedure: Coronary Stent Intervention w/Impella;  Surgeon: Lorretta Harp, MD;  Location: [place] [facility];  Service: Cardiovascular;  Laterality: N/A;   ECMO CANNULATION  04/12/2022   Procedure: ECMO CANNULATION;  Surgeon: Lorretta Harp, MD;  Location: [place] [facility];  Service: Cardiovascular;;   KNEE SURGERY     RIGHT/LEFT HEART CATH AND CORONARY ANGIOGRAPHY N/A 05/04/2022   Procedure: RIGHT/LEFT HEART CATH AND CORONARY ANGIOGRAPHY;  Surgeon: Lorretta Harp, MD;  Location: [place] [facility];  Service: Cardiovascular;  Laterality: N/A;   TEMPORARY PACEMAKER N/A 04/14/2022   Procedure: TEMPORARY PACEMAKER;  Surgeon: Lorretta Harp, MD;  Location:  [place] [facility];  Service: Cardiovascular;  Laterality: N/A;    Social History   Tobacco Use  Smoking Status Never  Smokeless Tobacco Not on file    Social History   Substance and Sexual Activity  Alcohol Use No    Social History   Socioeconomic History   Marital status: Single    Spouse name: Not on file   Number of children: Not on file   Years of education: Not on file   Highest education level: Not on file  Occupational History   Not on file  Tobacco Use   Smoking status: Never   Smokeless tobacco: Not on file  Substance and Sexual Activity   Alcohol use: No   Drug use: No   Sexual activity: Not on file  Other Topics Concern   Not on file  Social History Narrative   Not on file   Social Determinants of Health   Financial Resource Strain: Not on file  Food Insecurity: Not on file  Transportation Needs: Not on file  Physical Activity: Not on file  Stress: Not on file  Social Connections: Not on file  Intimate Partner Violence: Not on file    No Known Allergies  Current Facility-Administered Medications  Medication Dose Route Frequency Provider Last Rate Last Admin   0.9 %  sodium chloride infusion   Intravenous PRN Larey Dresser, MD   Stopped at 05/06/2022 1846   acetaminophen (TYLENOL) 160 MG/5ML solution 650 mg  650 mg Per Tube Q4H PRN Lyndee Leo, RPH       albumin human 5 % solution 12.5 g  12.5 g Intravenous Q15 min PRN Candee Furbish, MD   Stopped at 04/18/22 2028   amiodarone (PACERONE) tablet 200 mg  200 mg Per Tube BID Larey Dresser, MD   200 mg at 04/20/22 0957   aspirin chewable tablet 81 mg  81 mg Per Tube Daily Einar Grad, RPH   81 mg at 04/20/22 0957   atorvastatin (LIPITOR) tablet 80 mg  80 mg Per Tube Daily Larey Dresser, MD   80 mg at 04/20/22 0956   bivalirudin (ANGIOMAX) 250 mg in sodium chloride 0.9 % 500 mL (0.5 mg/mL) infusion  0.032 mg/kg/hr Intravenous Continuous Einar Grad, RPH 8.2 mL/hr at 04/20/22  1100 0.032 mg/kg/hr at 04/20/22 1100   Chlorhexidine Gluconate Cloth 2 % PADS 6 each  6 each Topical Daily Candee Furbish, MD   6 each at 04/20/22 1000   clonazePAM (KLONOPIN) tablet 0.5 mg  0.5 mg Per Tube BID Kipp Brood, MD   0.5 mg at 04/20/22 0957   EPINEPHrine (ADRENALIN) 10  mg in sodium chloride 0.9 % 250 mL (0.04 mg/mL) infusion  0.5-20 mcg/min Intravenous Titrated Lorin Glass, MD 6 mL/hr at 04/20/22 1100 4 mcg/min at 04/20/22 1100   feeding supplement (PIVOT 1.5 CAL) liquid 1,000 mL  1,000 mL Per Tube Continuous Lynnell Catalan, MD 20 mL/hr at 04/20/22 1100 Infusion Verify at 04/20/22 1100   fentaNYL (SUBLIMAZE) bolus via infusion 50-100 mcg  50-100 mcg Intravenous Q15 min PRN Runell Gess, MD   100 mcg at 05-12-22 1041   fentaNYL in NS (17mcg/ml) infusion-PREMIX  50-400 mcg/hr Intravenous Continuous Lynnell Catalan, MD 25 mL/hr at 04/20/22 1100 250 mcg/hr at 04/20/22 1100   heparin injection 1,000-6,000 Units  1,000-6,000 Units CRRT PRN Tyler Pita, MD       insulin aspart (novoLOG) injection 3-9 Units  3-9 Units Subcutaneous Q4H Steffanie Dunn, DO   3 Units at 04/20/22 0359   insulin detemir (LEVEMIR) injection 20 Units  20 Units Subcutaneous BID Steffanie Dunn, DO   20 Units at 04/20/22 0957   ipratropium-albuterol (DUONEB) 0.5-2.5 (3) MG/3ML nebulizer solution 3 mL  3 mL Nebulization Q4H Karie Fetch P, DO   3 mL at 04/20/22 0729   meropenem (MERREM) 1 g in sodium chloride 0.9 % 100 mL IVPB  1 g Intravenous Q8H Earnie Larsson, [facility]   Stopped at 04/20/22 3790   midazolam (VERSED) 100 mg/100 mL (1 mg/mL) premix infusion  0-10 mg/hr Intravenous Continuous Runell Gess, MD 7 mL/hr at 04/20/22 1100 7 mg/hr at 04/20/22 1100   midazolam (VERSED) bolus via infusion 0-5 mg  0-5 mg Intravenous Q1H PRN Runell Gess, MD       midazolam (VERSED) injection 2 mg  2 mg Intravenous Q15 min PRN Runell Gess, MD   2 mg at 04/20/22 0515   midazolam (VERSED)  injection 2 mg  2 mg Intravenous Q2H PRN Runell Gess, MD       midodrine (PROAMATINE) tablet 10 mg  10 mg Per Tube Q8H Laurey Morale, MD       multivitamin (RENA-VIT) tablet 1 tablet  1 tablet Per Tube QHS Lynnell Catalan, MD   1 tablet at 04/19/22 2108   norepinephrine (LEVOPHED) 16 mg in premix infusion  0-40 mcg/min Intravenous Titrated Lorin Glass, MD 28.1 mL/hr at 04/20/22 1100 30 mcg/min at 04/20/22 1100   ondansetron (ZOFRAN) injection 4 mg  4 mg Intravenous Q6H PRN Runell Gess, MD       Oral care mouth rinse  15 mL Mouth Rinse Q2H Lorin Glass, MD   15 mL at 04/20/22 1019   Oral care mouth rinse  15 mL Mouth Rinse PRN Lorin Glass, MD       oxyCODONE (Oxy IR/ROXICODONE) immediate release tablet 5 mg  5 mg Oral Q6H Agarwala, Ravi, MD   5 mg at 04/20/22 0957   pantoprazole (PROTONIX) injection 40 mg  40 mg Intravenous QHS Runell Gess, MD   40 mg at 04/19/22 2108   prismasol BGK 2/2.5 dialysis solution   CRRT Continuous Tyler Pita, MD 2,000 mL/hr at 04/20/22 1022 New Bag at 04/20/22 1022   prismasol BGK 2/2.5 replacement solution   CRRT Continuous Tyler Pita, MD 400 mL/hr at 04/20/22 0825 New Bag at 04/20/22 0825   prismasol BGK 2/2.5 replacement solution   CRRT Continuous Tyler Pita, MD 400 mL/hr at 04/20/22 0824 New Bag at 04/20/22 0824   QUEtiapine (SEROQUEL)  tablet 50 mg  50 mg Per Tube BID Kipp Brood, MD   50 mg at 04/20/22 0957   sodium bicarbonate 25 mEq (Impella PURGE) in dextrose 5 % 1000 mL bag   Intracatheter Continuous Larey Dresser, MD   New Bag at 04/20/22 1015   sodium chloride 0.9 % primer fluid for CRRT   CRRT PRN Justin Mend, MD       sodium chloride flush (NS) 0.9 % injection 10-40 mL  10-40 mL Intracatheter Q12H Candee Furbish, MD   10 mL at 04/19/22 2100   sodium chloride flush (NS) 0.9 % injection 3 mL  3 mL Intravenous Q12H Lorretta Harp, MD   3 mL at 04/20/22 0959   ticagrelor (BRILINTA) tablet 90  mg  90 mg Per Tube BID Lyndee Leo, RPH   90 mg at 04/20/22 0957   vasopressin (PITRESSIN) 20 Units in sodium chloride 0.9 % 100 mL infusion-*FOR SHOCK*  0-0.04 Units/min Intravenous Continuous Kipp Brood, MD 12 mL/hr at 04/20/22 1100 0.04 Units/min at 04/20/22 1100     History reviewed. No pertinent family history.   Review of Systems: Patient intubated and sedated    Cardiac Review of Systems: Y or N  Chest Pain [    ]  Resting SOB [   ] Exertional SOB  [  ]  Orthopnea [  ]   Pedal Edema [   ]    Palpitations [  ] Syncope  [  ]   Presyncope [   ]  General Review of Systems: [Y] = yes [  ]=no Constitional: recent weight change [  ]; anorexia [  ]; fatigue [  ]; nausea [  ]; night sweats [  ]; fever [  ]; or chills [  ];                                                                                                                                          Dental: poor dentition[  ]; Last Dentist visit:   Eye : blurred vision [  ]; diplopia [   ]; vision changes [  ];  Amaurosis fugax[  ]; Resp: cough [  ];  wheezing[  ];  hemoptysis[  ]; shortness of breath[  ]; paroxysmal nocturnal dyspnea[  ]; dyspnea on exertion[  ]; or orthopnea[  ];  GI:  gallstones[  ], vomiting[  ];  dysphagia[  ]; melena[  ];  hematochezia [  ]; heartburn[  ];   Hx of  Colonoscopy[  ]; GU: kidney stones [  ]; hematuria[  ];   dysuria [  ];  nocturia[  ];  history of     obstruction [  ];                 Skin: rash, swelling[  ];, hair loss[  ];  peripheral edema[  ];  or itching[  ]; Musculosketetal: myalgias[  ];  joint swelling[  ];  joint erythema[  ];  joint pain[  ];  back pain[  ];  Heme/Lymph: bruising[  ];  bleeding[  ];  anemia[  ];  Neuro: TIA[  ];  headaches[  ];  stroke[  ];  vertigo[  ];  seizures[  ];   paresthesias[  ];  difficulty walking[  ];  Psych:depression[  ]; anxiety[  ];  Endocrine: diabetes[  ];  thyroid dysfunction[  ];  Immunizations: Flu [  ]; Pneumococcal[   ];  Other:  Physical Exam: BP (!) 77/51 (BP Location: Left Arm)   Pulse 80   Temp (!) 97.5 F (36.4 C)   Resp 16   Ht 5\' 8"  (1.727 m)   Wt 117.7 kg   SpO2 100% Comment: per ABG  BMI 39.45 kg/m   General-patient sedated unresponsive with ECMO cannulas in the groins, Impella CP in the right groin, CVH dialysis catheter in the left neck and a temporary pacing wire in the right IJ Neck thick without bruit Lungs with scattered rhonchi.  No chest deformity Abdomen obese without guarding Extremities-edematous with positive Doppler pulses Neuro-nonresponsive to command  Diagnostic Studies & Laboratory data:     Recent Radiology Findings:   DG CHEST PORT 1 VIEW  Result Date: 04/20/2022 CLINICAL DATA:  Chest tube placement.  ECMO EXAM: PORTABLE CHEST 1 VIEW COMPARISON:  04/19/2022 FINDINGS: Endotracheal tube unchanged. Transjugular catheter in the RIGHT heart. Two LEFT central venous lines in the SVC. Aortic Impella device noted. Widened mediastinum similar prior. Lungs are clear. Low lung volumes. IMPRESSION: 1. Stable support apparatus. 2. No pulmonary edema or pneumothorax. 3. Prominent mediastinal width unchanged. Electronically Signed   By: Suzy Bouchard M.D.   On: 04/20/2022 08:23   DG Abd Portable 1V  Result Date: 04/19/2022 CLINICAL DATA:  Feeding tube placement. EXAM: PORTABLE ABDOMEN - 1 VIEW COMPARISON:  None Available. FINDINGS: The feeding tube tip is in the antropyloric region of the stomach. IMPRESSION: Feeding tube tip is in the antropyloric region of the stomach. Electronically Signed   By: Marijo Sanes M.D.   On: 04/19/2022 11:23   DG CHEST PORT 1 VIEW  Result Date: 04/19/2022 CLINICAL DATA:  [id number] congestive heart failure, on ECMO, follow-up study EXAM: PORTABLE CHEST 1 VIEW COMPARISON:  April 18, 2022 FINDINGS: Again seen are the endotracheal tube, NG tube, left transjugular central venous catheter with its tip at the junction of the left brachiocephalic vein and SVC,  left subclavian double-lumen central venous catheter with its tip at the upper SVC. Right transjugular catheter with its tip appears to be in the right ventricle. There is an electrical pad overlying the left lower thorax, obscuring the left lung base stable. Cardiomediastinal silhouette is prominent. Low lung volumes. Moderate pulmonary vascular congestion and has mildly improved in the interim. Likely mild bilateral pleural effusion. The visualized skeletal structures show moderate thoracic spondylosis. IMPRESSION: 1. There has been no significant interval change in the supporting tubes. 2.  Cardiomediastinal silhouette is prominent. 3. Moderate pulmonary vascular congestion and has improved in the interim. Low lung volumes. Electronically Signed   By: Frazier Richards M.D.   On: 04/19/2022 08:35   ECHOCARDIOGRAM LIMITED  Result Date: 04/18/2022    ECHOCARDIOGRAM LIMITED REPORT   Patient Name:   James White Date of Exam: 04/18/2022 Medical Rec #:  [id number]     Height:       68.0 in Accession #:  [id number]    Weight:       282.2 lb Date of Birth:  June 05, 1961     BSA:          2.366 m Patient Age:    31 years      BP:           67/61 mmHg Patient Gender: M             HR:           80 bpm. Exam Location:  Inpatient Procedure: Limited Echo and Intracardiac Opacification Agent Indications:    CHF  History:        Patient has prior history of Echocardiogram examinations, most                 recent 04/08/2022. CAD, Cardiac cath 04/17/21; Risk                 Factors:Hypertension, Diabetes and Dyslipidemia. Cardiac arrest.                 Renal failure on bedside dialysis. Respiratory failure on ECMO.  Sonographer:    Merrie Roof RDCS Referring Phys: [place]  1. Left ventricular ejection fraction, by estimation, is 40 to 45%. The left ventricle has mildly decreased function. Comparison(s): No significant change from prior study. Prior images reviewed side by side. Definity contrast was used.  No Doppler images were performed. FINDINGS  Left Ventricle: No left ventricular thrombus is seen. Impella well poitioned across the aortic valve. Left ventricular ejection fraction, by estimation, is 40 to 45%. The left ventricle has mildly decreased function. Definity contrast agent was given IV  to delineate the left ventricular endocardial borders.  LV Wall Scoring: The antero-lateral wall and posterior wall are akinetic. The entire anterior wall, entire septum, entire apex, and entire inferior wall are normal. Pericardium: There is no evidence of pericardial effusion. Sanda Klein MD Electronically signed by Sanda Klein MD Signature Date/Time: 04/18/2022/2:11:38 PM    Final       Recent Lab Findings: Lab Results  Component Value Date   WBC 9.9 04/20/2022   HGB 7.8 (L) 04/20/2022   HCT 23.0 (L) 04/20/2022   PLT 89 (L) 04/20/2022   GLUCOSE 140 (H) 04/20/2022   CHOL 149 04/18/2022   TRIG 191 (H) 04/18/2022   HDL 25 (L) 04/18/2022   LDLCALC 86 04/18/2022   ALT 295 (H) 04/20/2022   AST 632 (H) 04/20/2022   NA 136 04/20/2022   K 5.8 (H) 04/20/2022   CL 105 04/20/2022   CREATININE 2.63 (H) 04/20/2022   BUN 14 04/20/2022   CO2 26 04/20/2022   INR 1.4 (H) 04/20/2022   HGBA1C 7.1 (H) 04/18/2022      Assessment / Plan:   Maxie Better transition from ECMO to Impella 5.5 LVAD in OR tomorrow.  I discussed the procedure of Impella 5.5 placement via the right axillary artery with the patient's family.  They understand the purpose of the temporary LVAD and the risks involved including bleeding, infection, weakness of the right arm, and arrhythmias. They have provided their informed consent for this procedure.

## 2022-04-20 NOTE — Plan of Care (Signed)
  Problem: Cardiovascular: Goal: Vascular access site(s) Level 0-1 will be maintained Outcome: Progressing   Problem: Fluid Volume: Goal: Ability to maintain a balanced intake and output will improve Outcome: Progressing   Problem: Nutritional: Goal: Maintenance of adequate nutrition will improve Outcome: Progressing   Problem: Clinical Measurements: Goal: Diagnostic test results will improve Outcome: Progressing Goal: Respiratory complications will improve Outcome: Progressing Goal: Cardiovascular complication will be avoided Outcome: Progressing   Problem: Nutrition: Goal: Adequate nutrition will be maintained Outcome: Progressing   Problem: Pain Managment: Goal: General experience of comfort will improve Outcome: Progressing

## 2022-04-20 NOTE — Progress Notes (Signed)
Pharmacy Antibiotic Note  James White is a 61 y.o. male admitted on 2022-05-04 with  cardiac arrest requiring ECMO .  Pharmacy has been consulted for meropenem dosing.  Pt remains on ECMO and CRRT. WBC remains normal, lactate down.  Plan: Meropenem 1g q8 hours while on ecmo/crrt  Height: 5\' 8"  (172.7 cm) Weight: 117.7 kg (259 lb 7.7 oz) IBW/kg (Calculated) : 68.4  Temp (24hrs), Avg:97.3 F (36.3 C), Min:97.2 F (36.2 C), Max:97.7 F (36.5 C)  Recent Labs  Lab 04/18/22 1521 04/18/22 1744 04/19/22 0300 04/19/22 0533 04/19/22 1123 04/19/22 1609 04/20/22 0355  WBC 8.1  --  10.5  --  9.5 10.1 9.9  CREATININE 2.38*  --  2.42*  --  2.32* 2.49*  2.42* 2.63*  LATICACIDVEN  --  3.0* 3.3* 3.5* 2.8*  --  1.7     Estimated Creatinine Clearance: 36.8 mL/min (A) (by C-G formula based on SCr of 2.63 mg/dL (H)).    No Known Allergies  Thank you for allowing pharmacy to be a part of this patient's care.  04/28/2022, PharmD, BCPS, [facility] Clinical Pharmacist [phone] Please check AMION for all [facility] Pharmacy numbers 04/20/2022

## 2022-04-20 NOTE — Progress Notes (Signed)
NAME:  James White, MRN:  [id number], DOB:  1961-08-19, LOS: 3 ADMISSION DATE:  May 17, 2022, CONSULTATION DATE:  04/20/22 REFERRING MD:  Particia Nearing, CHIEF COMPLAINT:  syncope, HG   History of Present Illness:  James White is a 61 y.o. M with PMH of HTN and HL who presented to the ED with a syncopal episode while at work.  History taken from epic and patient was intubated.  He noted feeling poorly for several days prior and thought this was secondary to the head.  He went to work this morning and became diaphoretic and passed out.  On EMS arrival he was hypotensive and bradycardic as low as 29, EKG concerning for complete heart bloack.  He was intubated in the ED. Labs significant for trop of 99, creatinine 1.6, K 3.6, glucose >500 initially.  He was externally paced, then in intermittent atrial fib.  and seen by EP, plan for ICU admission and temp pacer with L and R heart cath.  PCCM consulted for ventilator management  Pertinent  Medical History   has a past medical history of High cholesterol and Hypertension.   Significant Hospital Events: Including procedures, antibiotic start and stop dates in addition to other pertinent events   7/10 Syncopal episode, bradycardia and complete HB, intubated, plan for temp pacer and cath lab, PCCM consult 7/10 DES to LAD, CX lesion. RCA patent.  7/10 brief arrest, temporary wire, VA ECMO via bifemoral cannulation, CP Impella to vent.  7/10 bleeding from left arterial cannulation site.  Bolsters placed by VVS 7/11 Improving pulsatility and oxygenation.   Interim History / Subjective:   Starting to wake up spontaneously. Decreasing pressor suppr  Objective   Blood pressure (!) 77/51, pulse 80, temperature (!) 97.3 F (36.3 C), resp. rate 16, height 5\' 8"  (1.727 m), weight 117.7 kg, SpO2 100 %. CVP:  [12 mmHg-34 mmHg] 16 mmHg  Vent Mode: PCV FiO2 (%):  [50 %] 50 % Set Rate:  [16 bmp] 16 bmp PEEP:  [10 cmH20] 10 cmH20 Plateau Pressure:  [24 cmH20-28  cmH20] 24 cmH20   Intake/Output Summary (Last 24 hours) at 04/20/2022 0924 Last data filed at 04/20/2022 0900 Gross per 24 hour  Intake 3400.49 ml  Output 5728 ml  Net -2327.51 ml    Filed Weights   04/18/22 0500 04/19/22 0500 04/20/22 0500  Weight: 128 kg 127.8 kg 117.7 kg    General:  critically ill-appearing M, intubated and sedated  HEENT: ++ facial swelling with scleral edema. ETT, OGT in place. Swelling improving.  Neuro: Sedated, off NMB, will frown to eye opening. Will move all limbs.  CV: VA ECMO 3.7 Lpm , Impella P1, currently on E, NE with 04/30/2022 of pulsatility. Extremities warm.  Sweep at 1 PULM:  PC15/10 Vt 450 and Pplat 25. Crackles a both bases.  GI: soft, obese, soft, no bowel sounds.  Extremities: + anasarca. Line sites intact. No bleeding from sites.  Skin: left flank is soft.   Ancillary tests personally reviewed:   7.466/34/141  Assessment & Plan:   Cardiogenic shock [facility]) Following STEMI. Now requiring ECMO support. Impella for LV venting. Improving LV function by echo.  -Continue ECMO support and monitor for LV recovery.  - Wean E/NE to keep MAP >70 and maintain 10 of pulsatility - Improving pulsatility - Echo show EF 40%  - Plan for decannulation tomorrow in OR. Hold on Impella 5.5 support.   Complete heart block (HCC) - TVPM set at 80 and requiring intermittent pacing with  good capture.   CAD (coronary artery disease) - Start lipitor 80 - ASA and Brillinta for stent.  Cardiac arrest (HCC)  Unable to assess due to sedation. BIS in 60's initially suggests some neurologic function.   - Slow sedation wean to obtain exam.   Acute on chronic respiratory failure with hypoxia and hypercapnia (HCC) On VA ECMO support.  Reasonable Vt and MV on rest settings. Lung compliance improving.    - Maintain current ventilator settings. Oxygenation should improve with fluid removal.  - Will likely be able to come off VA soon and transition to Impella alone.    Acute kidney injury (AKI) with acute tubular necrosis (ATN) (HCC) Minimal urine output.  Now on CRRT.  - Aggressive fluid removal as tolerated.  - Normal bicarb bath.    Hemorrhage due to vascular catheter (HCC) No bleeding from site.   - Monitor for further bleeding - Vascular surgery to close when ready to come off.   STEMI (ST elevation myocardial infarction) (HCC) S/P DES to LAD.  - Continue to monitor for LV recovery.   Best Practice (right click and "Reselect all SmartList Selections" daily)   Diet/type: NPO Start trickle feeds today  DVT prophylaxis: Systemic bivalirudin GI prophylaxis: PPI Lines: Right TVPM, L IJ TLC, L [place] HD catheter, RF CP, RF drainage, LF return.  Foley:  Yes, and it is still needed Code Status:  full code Last date of multidisciplinary goals of care discussion [per primary]  CRITICAL CARE Performed by: Lynnell Catalan  Total critical care time: 50 minutes  Critical care time was exclusive of separately billable procedures and treating other patients.  Critical care was necessary to treat or prevent imminent or life-threatening deterioration.  Critical care was time spent personally by me on the following activities: development of treatment plan with patient and/or surrogate as well as nursing, discussions with consultants, evaluation of patient's response to treatment, examination of patient, obtaining history from patient or surrogate, ordering and performing treatments and interventions, ordering and review of laboratory studies, ordering and review of radiographic studies, pulse oximetry and re-evaluation of patient's condition.  Lynnell Catalan, MD [facility] ICU Physician [facility] [place] [facility]  Pager: [phone] Or Epic Secure Chat After hours: [phone].  04/20/2022, 9:24 AM

## 2022-04-20 NOTE — Progress Notes (Signed)
Nutrition Follow-up  DOCUMENTATION CODES:   Not applicable  INTERVENTION:   Tube Feeding via Cortrak (gastric): Goal: Pivot 1.5 at 60 ml/hr  Increase to 30 ml/hr; titrate by 10 mL q 8 hours until goal rate of 60 ml/hr once stable to advance Pro-Source TF 45 mL TID Goal regimen provides 164 g of protein, 2280 kcals and 1094 mL of free water  Plan to increase Pro-Source TF to 90 mL QID in the interim until pt tolerating TF at goal rate. This provides 88 g of protein and 320 kcals. Aiming to provide increased protein until able to get to goal on TF regimen, in addition to TF being placed on hold for procedure  Pt with femoral cannulation; Reverse Trendelenburg >10 degrees at all times while TF infusion until able to perform HOB >30 degrees   Renal MVI daily  No BM day 3, monitor  NUTRITION DIAGNOSIS:   Inadequate oral intake related to acute illness as evidenced by NPO status.  Being addressed via TF   GOAL:   Patient will meet greater than or equal to 90% of their needs  Progressing  MONITOR:   TF tolerance, Vent status, Labs, Weight trends, Skin  REASON FOR ASSESSMENT:   Consult Enteral/tube feeding initiation and management  ASSESSMENT:   61 yo male admitted with syncopal episode, bradycardia with complete HB, intubated,  developed cardiogenic shock and taken to cath lab for emergent heart cath, PCI.  Impella placed for cardiogenic shock, ultimately required cannulation for VA ECMO. PMH includes HTN, HLD  7/10 - Intubated, brief arrest post PCI, impella placed for persistent shock, VA ECMO via bi-fem cannulation. CRRT initiated 7/12 - cortak tube placed (gastric). Trickle TF initiated   VA ECMO day 3.  Sweep down to 1 Noted plan to transition from ECMO to Impella 5.5 VAD in OR tomorrow.  Pt remains on vent support, on CRRT. Stable MAP on Levophed 13, epinephrine 4, vasopressin 0.03. MAP goal >70, has been at least 60 or greater. Diastolic pressure >50  Tolerating  Pivot 1.5 at 20 ml/hr via Cortrak  Mild hyperkalemia, noted Nephrology changed all fluids to 2K+  No skin breakdown per RN skin assessment  No BM day 3  Weight down to 117.7 kg; post op wt of 128 kg. Pt remains net +4L for the admission but negative 2L in previous 24 hours. Minimal UOP. CRRT UF 5.5 L in 24 hours  Labs: potassium 5.9, phosphorus 5.4, magnesium 2.8, CBGs 119-158 Meds: renal MVI, ss novolog, levemir   Diet Order:   Diet Order             Diet NPO time specified  Diet effective midnight           Diet NPO time specified  Diet effective now                   EDUCATION NEEDS:   Not appropriate for education at this time  Skin:  Skin Assessment: Reviewed RN Assessment  Last BM:  no documented BM  Height:   Ht Readings from Last 1 Encounters:  04/08/2022 5\' 8"  (1.727 m)    Weight:   Wt Readings from Last 1 Encounters:  04/20/22 117.7 kg    Ideal Body Weight:  70 kg  BMI:  Body mass index is 39.45 kg/m.  Estimated Nutritional Needs:   Kcal:  2100-2400 kcals  Protein:  140-175 g  Fluid:  1.8 L   05/01/2022 MS, RDN, LDN, CNSC Registered Dietitian  3 Clinical Nutrition RD Pager and On-Call Pager Number Located in [place]

## 2022-04-20 NOTE — Progress Notes (Signed)
Pharmacy Antibiotic Note  James White is a 62 y.o. male admitted on 11-May-2022 with cardiac arrest requiring ECMO and Impella.  Pharmacy has been consulted to add empiric vancomycin to meropenem.    Patient remains on CRRT - hypothermic, WBC WNL.  Plan: Vanc 2gm IV x 1, then 1250mg  IV Q24H Continue Merrem 1gm IV Q8H Monitor CRRT tolerance/interruption, clinical progress, vanc level as indicated  Height: 5\' 8"  (172.7 cm) Weight: 117.7 kg (259 lb 7.7 oz) IBW/kg (Calculated) : 68.4  Temp (24hrs), Avg:97.3 F (36.3 C), Min:97.2 F (36.2 C), Max:97.7 F (36.5 C)  Recent Labs  Lab 04/18/22 1744 04/19/22 0300 04/19/22 0533 04/19/22 1123 04/19/22 1609 04/20/22 0355 04/20/22 1603  WBC  --  10.5  --  9.5 10.1 9.9 8.8  CREATININE  --  2.42*  --  2.32* 2.49*  2.42* 2.63* 2.52*  2.50*  LATICACIDVEN 3.0* 3.3* 3.5* 2.8*  --  1.7  --     Estimated Creatinine Clearance: 38.7 mL/min (A) (by C-G formula based on SCr of 2.5 mg/dL (H)).    No Known Allergies  7/10 MRSA PCR - negative  James White D. 04/26/2022, PharmD, BCPS, BCCCP 04/20/2022, 6:27 PM

## 2022-04-21 ENCOUNTER — Inpatient Hospital Stay (HOSPITAL_COMMUNITY): Payer: Medicaid Other | Admitting: Anesthesiology

## 2022-04-21 ENCOUNTER — Inpatient Hospital Stay (HOSPITAL_COMMUNITY): Payer: Medicaid Other

## 2022-04-21 ENCOUNTER — Encounter (HOSPITAL_COMMUNITY): Admission: EM | Disposition: E | Payer: Self-pay | Source: Home / Self Care | Attending: Pulmonary Disease

## 2022-04-21 DIAGNOSIS — R570 Cardiogenic shock: Secondary | ICD-10-CM

## 2022-04-21 DIAGNOSIS — Z95811 Presence of heart assist device: Secondary | ICD-10-CM

## 2022-04-21 DIAGNOSIS — I110 Hypertensive heart disease with heart failure: Secondary | ICD-10-CM

## 2022-04-21 DIAGNOSIS — I509 Heart failure, unspecified: Secondary | ICD-10-CM

## 2022-04-21 DIAGNOSIS — I2510 Atherosclerotic heart disease of native coronary artery without angina pectoris: Secondary | ICD-10-CM

## 2022-04-21 DIAGNOSIS — I5021 Acute systolic (congestive) heart failure: Secondary | ICD-10-CM

## 2022-04-21 HISTORY — PX: REMOVAL OF IMPELLA LEFT VENTRICULAR ASSIST DEVICE: SHX6556

## 2022-04-21 HISTORY — PX: TEE WITHOUT CARDIOVERSION: SHX5443

## 2022-04-21 HISTORY — PX: PLACEMENT OF IMPELLA LEFT VENTRICULAR ASSIST DEVICE: SHX6519

## 2022-04-21 LAB — POCT I-STAT 7, (LYTES, BLD GAS, ICA,H+H)
Acid-Base Excess: 0 mmol/L (ref 0.0–2.0)
Acid-Base Excess: 0 mmol/L (ref 0.0–2.0)
Acid-Base Excess: 2 mmol/L (ref 0.0–2.0)
Acid-Base Excess: 2 mmol/L (ref 0.0–2.0)
Acid-Base Excess: 2 mmol/L (ref 0.0–2.0)
Acid-Base Excess: 2 mmol/L (ref 0.0–2.0)
Acid-Base Excess: 3 mmol/L — ABNORMAL HIGH (ref 0.0–2.0)
Acid-Base Excess: 3 mmol/L — ABNORMAL HIGH (ref 0.0–2.0)
Acid-Base Excess: 3 mmol/L — ABNORMAL HIGH (ref 0.0–2.0)
Acid-Base Excess: 5 mmol/L — ABNORMAL HIGH (ref 0.0–2.0)
Acid-Base Excess: 5 mmol/L — ABNORMAL HIGH (ref 0.0–2.0)
Acid-base deficit: 1 mmol/L (ref 0.0–2.0)
Acid-base deficit: 10 mmol/L — ABNORMAL HIGH (ref 0.0–2.0)
Acid-base deficit: 8 mmol/L — ABNORMAL HIGH (ref 0.0–2.0)
Bicarbonate: 19.9 mmol/L — ABNORMAL LOW (ref 20.0–28.0)
Bicarbonate: 20.6 mmol/L (ref 20.0–28.0)
Bicarbonate: 23.8 mmol/L (ref 20.0–28.0)
Bicarbonate: 25.5 mmol/L (ref 20.0–28.0)
Bicarbonate: 25.8 mmol/L (ref 20.0–28.0)
Bicarbonate: 26 mmol/L (ref 20.0–28.0)
Bicarbonate: 26.2 mmol/L (ref 20.0–28.0)
Bicarbonate: 26.8 mmol/L (ref 20.0–28.0)
Bicarbonate: 27.1 mmol/L (ref 20.0–28.0)
Bicarbonate: 27.5 mmol/L (ref 20.0–28.0)
Bicarbonate: 28 mmol/L (ref 20.0–28.0)
Bicarbonate: 29.6 mmol/L — ABNORMAL HIGH (ref 20.0–28.0)
Bicarbonate: 30.1 mmol/L — ABNORMAL HIGH (ref 20.0–28.0)
Bicarbonate: 31.9 mmol/L — ABNORMAL HIGH (ref 20.0–28.0)
Calcium, Ion: 0.57 mmol/L — CL (ref 1.15–1.40)
Calcium, Ion: 0.63 mmol/L — CL (ref 1.15–1.40)
Calcium, Ion: 0.69 mmol/L — CL (ref 1.15–1.40)
Calcium, Ion: 0.95 mmol/L — ABNORMAL LOW (ref 1.15–1.40)
Calcium, Ion: 0.99 mmol/L — ABNORMAL LOW (ref 1.15–1.40)
Calcium, Ion: 1.01 mmol/L — ABNORMAL LOW (ref 1.15–1.40)
Calcium, Ion: 1.02 mmol/L — ABNORMAL LOW (ref 1.15–1.40)
Calcium, Ion: 1.04 mmol/L — ABNORMAL LOW (ref 1.15–1.40)
Calcium, Ion: 1.05 mmol/L — ABNORMAL LOW (ref 1.15–1.40)
Calcium, Ion: 1.05 mmol/L — ABNORMAL LOW (ref 1.15–1.40)
Calcium, Ion: 1.06 mmol/L — ABNORMAL LOW (ref 1.15–1.40)
Calcium, Ion: 1.07 mmol/L — ABNORMAL LOW (ref 1.15–1.40)
Calcium, Ion: 1.07 mmol/L — ABNORMAL LOW (ref 1.15–1.40)
Calcium, Ion: 1.07 mmol/L — ABNORMAL LOW (ref 1.15–1.40)
HCT: 19 % — ABNORMAL LOW (ref 39.0–52.0)
HCT: 20 % — ABNORMAL LOW (ref 39.0–52.0)
HCT: 20 % — ABNORMAL LOW (ref 39.0–52.0)
HCT: 20 % — ABNORMAL LOW (ref 39.0–52.0)
HCT: 20 % — ABNORMAL LOW (ref 39.0–52.0)
HCT: 20 % — ABNORMAL LOW (ref 39.0–52.0)
HCT: 22 % — ABNORMAL LOW (ref 39.0–52.0)
HCT: 23 % — ABNORMAL LOW (ref 39.0–52.0)
HCT: 23 % — ABNORMAL LOW (ref 39.0–52.0)
HCT: 24 % — ABNORMAL LOW (ref 39.0–52.0)
HCT: 25 % — ABNORMAL LOW (ref 39.0–52.0)
HCT: 35 % — ABNORMAL LOW (ref 39.0–52.0)
HCT: 38 % — ABNORMAL LOW (ref 39.0–52.0)
HCT: 38 % — ABNORMAL LOW (ref 39.0–52.0)
Hemoglobin: 11.9 g/dL — ABNORMAL LOW (ref 13.0–17.0)
Hemoglobin: 12.9 g/dL — ABNORMAL LOW (ref 13.0–17.0)
Hemoglobin: 12.9 g/dL — ABNORMAL LOW (ref 13.0–17.0)
Hemoglobin: 6.5 g/dL — CL (ref 13.0–17.0)
Hemoglobin: 6.8 g/dL — CL (ref 13.0–17.0)
Hemoglobin: 6.8 g/dL — CL (ref 13.0–17.0)
Hemoglobin: 6.8 g/dL — CL (ref 13.0–17.0)
Hemoglobin: 6.8 g/dL — CL (ref 13.0–17.0)
Hemoglobin: 6.8 g/dL — CL (ref 13.0–17.0)
Hemoglobin: 7.5 g/dL — ABNORMAL LOW (ref 13.0–17.0)
Hemoglobin: 7.8 g/dL — ABNORMAL LOW (ref 13.0–17.0)
Hemoglobin: 7.8 g/dL — ABNORMAL LOW (ref 13.0–17.0)
Hemoglobin: 8.2 g/dL — ABNORMAL LOW (ref 13.0–17.0)
Hemoglobin: 8.5 g/dL — ABNORMAL LOW (ref 13.0–17.0)
O2 Saturation: 100 %
O2 Saturation: 100 %
O2 Saturation: 100 %
O2 Saturation: 100 %
O2 Saturation: 100 %
O2 Saturation: 100 %
O2 Saturation: 100 %
O2 Saturation: 41 %
O2 Saturation: 47 %
O2 Saturation: 49 %
O2 Saturation: 98 %
O2 Saturation: 99 %
O2 Saturation: 99 %
O2 Saturation: 99 %
Patient temperature: 36.3
Patient temperature: 36.3
Patient temperature: 36.4
Patient temperature: 36.4
Patient temperature: 36.4
Patient temperature: 36.5
Patient temperature: 36.7
Patient temperature: 38.3
Potassium: 4.1 mmol/L (ref 3.5–5.1)
Potassium: 4.1 mmol/L (ref 3.5–5.1)
Potassium: 4.3 mmol/L (ref 3.5–5.1)
Potassium: 4.3 mmol/L (ref 3.5–5.1)
Potassium: 4.4 mmol/L (ref 3.5–5.1)
Potassium: 4.5 mmol/L (ref 3.5–5.1)
Potassium: 4.6 mmol/L (ref 3.5–5.1)
Potassium: 4.6 mmol/L (ref 3.5–5.1)
Potassium: 4.7 mmol/L (ref 3.5–5.1)
Potassium: 5.1 mmol/L (ref 3.5–5.1)
Potassium: 5.9 mmol/L — ABNORMAL HIGH (ref 3.5–5.1)
Potassium: 6.7 mmol/L (ref 3.5–5.1)
Potassium: 6.9 mmol/L (ref 3.5–5.1)
Potassium: 7.1 mmol/L (ref 3.5–5.1)
Sodium: 134 mmol/L — ABNORMAL LOW (ref 135–145)
Sodium: 135 mmol/L (ref 135–145)
Sodium: 135 mmol/L (ref 135–145)
Sodium: 136 mmol/L (ref 135–145)
Sodium: 136 mmol/L (ref 135–145)
Sodium: 136 mmol/L (ref 135–145)
Sodium: 136 mmol/L (ref 135–145)
Sodium: 136 mmol/L (ref 135–145)
Sodium: 137 mmol/L (ref 135–145)
Sodium: 137 mmol/L (ref 135–145)
Sodium: 137 mmol/L (ref 135–145)
Sodium: 138 mmol/L (ref 135–145)
Sodium: 138 mmol/L (ref 135–145)
Sodium: 143 mmol/L (ref 135–145)
TCO2: 21 mmol/L — ABNORMAL LOW (ref 22–32)
TCO2: 23 mmol/L (ref 22–32)
TCO2: 25 mmol/L (ref 22–32)
TCO2: 26 mmol/L (ref 22–32)
TCO2: 27 mmol/L (ref 22–32)
TCO2: 27 mmol/L (ref 22–32)
TCO2: 27 mmol/L (ref 22–32)
TCO2: 28 mmol/L (ref 22–32)
TCO2: 28 mmol/L (ref 22–32)
TCO2: 29 mmol/L (ref 22–32)
TCO2: 29 mmol/L (ref 22–32)
TCO2: 31 mmol/L (ref 22–32)
TCO2: 31 mmol/L (ref 22–32)
TCO2: 35 mmol/L — ABNORMAL HIGH (ref 22–32)
pCO2 arterial: 32.1 mmHg (ref 32–48)
pCO2 arterial: 32.7 mmHg (ref 32–48)
pCO2 arterial: 35.7 mmHg (ref 32–48)
pCO2 arterial: 40.1 mmHg (ref 32–48)
pCO2 arterial: 40.6 mmHg (ref 32–48)
pCO2 arterial: 40.7 mmHg (ref 32–48)
pCO2 arterial: 41.4 mmHg (ref 32–48)
pCO2 arterial: 42.2 mmHg (ref 32–48)
pCO2 arterial: 44.2 mmHg (ref 32–48)
pCO2 arterial: 45.1 mmHg (ref 32–48)
pCO2 arterial: 51.9 mmHg — ABNORMAL HIGH (ref 32–48)
pCO2 arterial: 52.9 mmHg — ABNORMAL HIGH (ref 32–48)
pCO2 arterial: 67.1 mmHg (ref 32–48)
pCO2 arterial: 91.9 mmHg (ref 32–48)
pH, Arterial: 7.095 — CL (ref 7.35–7.45)
pH, Arterial: 7.148 — CL (ref 7.35–7.45)
pH, Arterial: 7.191 — CL (ref 7.35–7.45)
pH, Arterial: 7.303 — ABNORMAL LOW (ref 7.35–7.45)
pH, Arterial: 7.381 (ref 7.35–7.45)
pH, Arterial: 7.386 (ref 7.35–7.45)
pH, Arterial: 7.427 (ref 7.35–7.45)
pH, Arterial: 7.428 (ref 7.35–7.45)
pH, Arterial: 7.436 (ref 7.35–7.45)
pH, Arterial: 7.44 (ref 7.35–7.45)
pH, Arterial: 7.46 — ABNORMAL HIGH (ref 7.35–7.45)
pH, Arterial: 7.472 — ABNORMAL HIGH (ref 7.35–7.45)
pH, Arterial: 7.505 — ABNORMAL HIGH (ref 7.35–7.45)
pH, Arterial: 7.506 — ABNORMAL HIGH (ref 7.35–7.45)
pO2, Arterial: 114 mmHg — ABNORMAL HIGH (ref 83–108)
pO2, Arterial: 148 mmHg — ABNORMAL HIGH (ref 83–108)
pO2, Arterial: 151 mmHg — ABNORMAL HIGH (ref 83–108)
pO2, Arterial: 154 mmHg — ABNORMAL HIGH (ref 83–108)
pO2, Arterial: 197 mmHg — ABNORMAL HIGH (ref 83–108)
pO2, Arterial: 31 mmHg — CL (ref 83–108)
pO2, Arterial: 32 mmHg — CL (ref 83–108)
pO2, Arterial: 349 mmHg — ABNORMAL HIGH (ref 83–108)
pO2, Arterial: 357 mmHg — ABNORMAL HIGH (ref 83–108)
pO2, Arterial: 362 mmHg — ABNORMAL HIGH (ref 83–108)
pO2, Arterial: 37 mmHg — CL (ref 83–108)
pO2, Arterial: 385 mmHg — ABNORMAL HIGH (ref 83–108)
pO2, Arterial: 552 mmHg — ABNORMAL HIGH (ref 83–108)
pO2, Arterial: 96 mmHg (ref 83–108)

## 2022-04-21 LAB — CBC
HCT: 21.8 % — ABNORMAL LOW (ref 39.0–52.0)
Hemoglobin: 6.8 g/dL — CL (ref 13.0–17.0)
MCH: 30 pg (ref 26.0–34.0)
MCHC: 31.2 g/dL (ref 30.0–36.0)
MCV: 96 fL (ref 80.0–100.0)
Platelets: 73 10*3/uL — ABNORMAL LOW (ref 150–400)
RBC: 2.27 MIL/uL — ABNORMAL LOW (ref 4.22–5.81)
RDW: 16.7 % — ABNORMAL HIGH (ref 11.5–15.5)
WBC: 7.7 10*3/uL (ref 4.0–10.5)
nRBC: 0.3 % — ABNORMAL HIGH (ref 0.0–0.2)

## 2022-04-21 LAB — POCT I-STAT, CHEM 8
BUN: 22 mg/dL (ref 8–23)
BUN: 26 mg/dL — ABNORMAL HIGH (ref 8–23)
BUN: 24 mg/dL — ABNORMAL HIGH (ref 8–23)
Calcium, Ion: 0.99 mmol/L — ABNORMAL LOW (ref 1.15–1.40)
Calcium, Ion: 0.98 mmol/L — ABNORMAL LOW (ref 1.15–1.40)
Calcium, Ion: 1.02 mmol/L — ABNORMAL LOW (ref 1.15–1.40)
Chloride: 102 mmol/L (ref 98–111)
Chloride: 104 mmol/L (ref 98–111)
Chloride: 102 mmol/L (ref 98–111)
Creatinine, Ser: 3 mg/dL — ABNORMAL HIGH (ref 0.61–1.24)
Creatinine, Ser: 3.1 mg/dL — ABNORMAL HIGH (ref 0.61–1.24)
Creatinine, Ser: 3.3 mg/dL — ABNORMAL HIGH (ref 0.61–1.24)
Glucose, Bld: 126 mg/dL — ABNORMAL HIGH (ref 70–99)
Glucose, Bld: 111 mg/dL — ABNORMAL HIGH (ref 70–99)
Glucose, Bld: 96 mg/dL (ref 70–99)
HCT: 28 % — ABNORMAL LOW (ref 39.0–52.0)
HCT: 24 % — ABNORMAL LOW (ref 39.0–52.0)
HCT: 24 % — ABNORMAL LOW (ref 39.0–52.0)
Hemoglobin: 9.5 g/dL — ABNORMAL LOW (ref 13.0–17.0)
Hemoglobin: 8.2 g/dL — ABNORMAL LOW (ref 13.0–17.0)
Hemoglobin: 8.2 g/dL — ABNORMAL LOW (ref 13.0–17.0)
Potassium: 4.6 mmol/L (ref 3.5–5.1)
Potassium: 4.5 mmol/L (ref 3.5–5.1)
Potassium: 4.7 mmol/L (ref 3.5–5.1)
Sodium: 137 mmol/L (ref 135–145)
Sodium: 137 mmol/L (ref 135–145)
Sodium: 137 mmol/L (ref 135–145)
TCO2: 24 mmol/L (ref 22–32)
TCO2: 25 mmol/L (ref 22–32)
TCO2: 24 mmol/L (ref 22–32)

## 2022-04-21 LAB — COMPREHENSIVE METABOLIC PANEL
ALT: 241 U/L — ABNORMAL HIGH (ref 0–44)
AST: 498 U/L — ABNORMAL HIGH (ref 15–41)
Albumin: 2.4 g/dL — ABNORMAL LOW (ref 3.5–5.0)
Alkaline Phosphatase: 54 U/L (ref 38–126)
Anion gap: 10 (ref 5–15)
BUN: 21 mg/dL (ref 8–23)
CO2: 25 mmol/L (ref 22–32)
Calcium: 7.4 mg/dL — ABNORMAL LOW (ref 8.9–10.3)
Chloride: 102 mmol/L (ref 98–111)
Creatinine, Ser: 2.49 mg/dL — ABNORMAL HIGH (ref 0.61–1.24)
GFR, Estimated: 29 mL/min — ABNORMAL LOW (ref >60)
Glucose, Bld: 195 mg/dL — ABNORMAL HIGH (ref 70–99)
Potassium: 4.4 mmol/L (ref 3.5–5.1)
Sodium: 137 mmol/L (ref 135–145)
Total Bilirubin: 0.7 mg/dL (ref 0.3–1.2)
Total Protein: 5.1 g/dL — ABNORMAL LOW (ref 6.5–8.1)

## 2022-04-21 LAB — LACTIC ACID, PLASMA: Lactic Acid, Venous: 1.6 mmol/L (ref 0.5–1.9)

## 2022-04-21 LAB — PROTIME-INR
INR: 1.4 — ABNORMAL HIGH (ref 0.8–1.2)
Prothrombin Time: 17.4 seconds — ABNORMAL HIGH (ref 11.4–15.2)

## 2022-04-21 LAB — PREPARE RBC (CROSSMATCH)

## 2022-04-21 LAB — MAGNESIUM: Magnesium: 2.5 mg/dL — ABNORMAL HIGH (ref 1.7–2.4)

## 2022-04-21 LAB — ECHOCARDIOGRAM LIMITED
Height: 68 in
Height: 68 in
Weight: 4402.15 oz
Weight: 4402.15 oz

## 2022-04-21 LAB — ECHO INTRAOPERATIVE TEE
Height: 68 in
Weight: 4402.15 oz

## 2022-04-21 LAB — HEMOGLOBIN AND HEMATOCRIT, BLOOD
HCT: 22.4 % — ABNORMAL LOW (ref 39.0–52.0)
Hemoglobin: 7.2 g/dL — ABNORMAL LOW (ref 13.0–17.0)

## 2022-04-21 LAB — FIBRINOGEN
Fibrinogen: >800 mg/dL — ABNORMAL HIGH (ref 210–475)
Fibrinogen: 379 mg/dL (ref 210–475)

## 2022-04-21 LAB — LACTATE DEHYDROGENASE: LDH: 1537 U/L — ABNORMAL HIGH (ref 98–192)

## 2022-04-21 LAB — APTT
aPTT: 62 seconds — ABNORMAL HIGH (ref 24–36)
aPTT: >200 seconds (ref 24–36)

## 2022-04-21 LAB — PHOSPHORUS: Phosphorus: 3.6 mg/dL (ref 2.5–4.6)

## 2022-04-21 LAB — GLUCOSE, CAPILLARY
Glucose-Capillary: 126 mg/dL — ABNORMAL HIGH (ref 70–99)
Glucose-Capillary: 134 mg/dL — ABNORMAL HIGH (ref 70–99)
Glucose-Capillary: 160 mg/dL — ABNORMAL HIGH (ref 70–99)
Glucose-Capillary: 192 mg/dL — ABNORMAL HIGH (ref 70–99)
Glucose-Capillary: 221 mg/dL — ABNORMAL HIGH (ref 70–99)

## 2022-04-21 SURGERY — CANCELLED PROCEDURE
Anesthesia: General

## 2022-04-21 SURGERY — DECANNULATION FOR  ECMO (EXTRACORPOREAL MEMBRANE OXYGENATION)
Anesthesia: General | Site: Groin | Laterality: Right

## 2022-04-21 MED ORDER — SODIUM CHLORIDE 0.9 % IV SOLN: INTRAVENOUS | Status: DC | PRN

## 2022-04-21 MED ORDER — EPINEPHRINE PF 1 MG/ML IJ SOLN
INTRAMUSCULAR | Status: DC | PRN
  Administered 2022-04-21: .5 mg via INTRAVENOUS
  Administered 2022-04-21: 1 mg via INTRAVENOUS
  Administered 2022-04-22 (×4): .1 mg via INTRAVENOUS

## 2022-04-21 MED ORDER — ROCURONIUM BROMIDE 10 MG/ML (PF) SYRINGE
PREFILLED_SYRINGE | INTRAVENOUS | Status: AC
  Filled 2022-04-21: qty 20

## 2022-04-21 MED ORDER — HEPARIN SODIUM (PORCINE) 5000 UNIT/ML IJ SOLN
INTRAMUSCULAR | Status: DC
  Filled 2022-04-21: qty 10

## 2022-04-21 MED ORDER — DEXTROSE 5 % IV SOLN
INTRAVENOUS | Status: DC | PRN
  Administered 2022-04-21: 3 g via INTRAVENOUS

## 2022-04-21 MED ORDER — CALCIUM GLUCONATE-NACL 1-0.675 GM/50ML-% IV SOLN
1.0000 g | Freq: Once | INTRAVENOUS | Status: AC
  Administered 2022-04-21: 1000 mg via INTRAVENOUS
  Filled 2022-04-21: qty 50

## 2022-04-21 MED ORDER — MIDODRINE HCL 5 MG PO TABS
15.0000 mg | ORAL_TABLET | Freq: Three times a day (TID) | ORAL | Status: DC
Start: 2022-04-21 — End: 2022-04-22
  Administered 2022-04-21: 15 mg
  Filled 2022-04-21: qty 3

## 2022-04-21 MED ORDER — HEPARIN SODIUM (PORCINE) 5000 UNIT/ML IJ SOLN
INTRAVENOUS | Status: DC
  Filled 2022-04-21: qty 10

## 2022-04-21 MED ORDER — SODIUM CHLORIDE 0.9% IV SOLUTION: Freq: Once | INTRAVENOUS | Status: AC

## 2022-04-21 MED ORDER — HEMOSTATIC AGENTS (NO CHARGE) OPTIME
TOPICAL | Status: DC | PRN
  Administered 2022-04-21: 1 via TOPICAL

## 2022-04-21 MED ORDER — 0.9 % SODIUM CHLORIDE (POUR BTL) OPTIME
TOPICAL | Status: DC | PRN
  Administered 2022-04-21: 3000 mL

## 2022-04-21 MED ORDER — ALBUMIN HUMAN 5 % IV SOLN
INTRAVENOUS | Status: AC
  Filled 2022-04-21: qty 500

## 2022-04-21 MED ORDER — SODIUM CHLORIDE 0.9 % IV SOLN: 10.0000 mL/h | Freq: Once | INTRAVENOUS | Status: DC

## 2022-04-21 MED ORDER — SODIUM BICARBONATE 8.4 % IV SOLN
INTRAVENOUS | Status: DC | PRN
  Administered 2022-04-21: 100 meq via INTRAVENOUS
  Administered 2022-04-21 (×4): 50 meq via INTRAVENOUS

## 2022-04-21 MED ORDER — SODIUM BICARBONATE 8.4 % IV SOLN
INTRAVENOUS | Status: AC
  Filled 2022-04-21: qty 100

## 2022-04-21 MED ORDER — DOBUTAMINE IN D5W 4-5 MG/ML-% IV SOLN
2.5000 ug/kg/min | INTRAVENOUS | Status: DC
  Administered 2022-04-21: 5 ug/kg/min via INTRAVENOUS
  Filled 2022-04-21: qty 250

## 2022-04-21 MED ORDER — SODIUM BICARBONATE 8.4 % IV SOLN
INTRAVENOUS | Status: DC
  Administered 2022-04-21: 125 mL/h via INTRAVENOUS
  Filled 2022-04-21 (×2): qty 1000

## 2022-04-21 MED ORDER — ATROPINE SULFATE 1 MG/10ML IJ SOSY
PREFILLED_SYRINGE | INTRAMUSCULAR | Status: AC
  Filled 2022-04-21: qty 20

## 2022-04-21 MED ORDER — ROCURONIUM BROMIDE 10 MG/ML (PF) SYRINGE
PREFILLED_SYRINGE | INTRAVENOUS | Status: DC | PRN
  Administered 2022-04-21: 30 mg via INTRAVENOUS
  Administered 2022-04-21 (×2): 50 mg via INTRAVENOUS
  Administered 2022-04-21 (×2): 30 mg via INTRAVENOUS
  Administered 2022-04-21: 50 mg via INTRAVENOUS
  Administered 2022-04-21 (×2): 30 mg via INTRAVENOUS

## 2022-04-21 MED ORDER — HEPARIN SODIUM (PORCINE) 1000 UNIT/ML IJ SOLN
INTRAMUSCULAR | Status: DC | PRN
  Administered 2022-04-21 (×4): 4000 [IU] via INTRAVENOUS
  Administered 2022-04-21: 2000 [IU] via INTRAVENOUS
  Administered 2022-04-21: 3000 [IU] via INTRAVENOUS

## 2022-04-21 MED ORDER — CALCIUM CHLORIDE 10 % IV SOLN: 1.0000 g | Freq: Once | INTRAVENOUS | Status: DC

## 2022-04-21 MED ORDER — SODIUM CHLORIDE 0.9% IV SOLUTION: Freq: Once | INTRAVENOUS | Status: DC

## 2022-04-21 MED ORDER — HEPARIN SODIUM (PORCINE) 1000 UNIT/ML IJ SOLN
INTRAMUSCULAR | Status: DC | PRN
  Administered 2022-04-21: 2.8 [IU]

## 2022-04-21 MED ORDER — HEPARIN 6000 UNIT IRRIGATION SOLUTION
Status: DC | PRN
  Administered 2022-04-21 (×2): 1

## 2022-04-21 MED ORDER — EPINEPHRINE 1 MG/10ML IJ SOSY
PREFILLED_SYRINGE | INTRAMUSCULAR | Status: AC
  Filled 2022-04-21: qty 20

## 2022-04-21 MED ORDER — HEPARIN 6000 UNIT IRRIGATION SOLUTION
Status: AC
  Filled 2022-04-21: qty 500

## 2022-04-21 MED ORDER — ALBUMIN HUMAN 5 % IV SOLN: INTRAVENOUS | Status: DC | PRN

## 2022-04-21 MED ORDER — HEPARIN SODIUM (PORCINE) 1000 UNIT/ML IJ SOLN
INTRAMUSCULAR | Status: AC
  Filled 2022-04-21: qty 10

## 2022-04-21 MED ORDER — HEPARIN SODIUM (PORCINE) 1000 UNIT/ML IJ SOLN
INTRAMUSCULAR | Status: AC
  Filled 2022-04-21: qty 1

## 2022-04-21 MED ORDER — MILRINONE LACTATE IN DEXTROSE 20-5 MG/100ML-% IV SOLN
INTRAVENOUS | Status: DC | PRN
  Administered 2022-04-21: .3 ug/kg/min via INTRAVENOUS

## 2022-04-21 MED ORDER — CALCIUM CHLORIDE 10 % IV SOLN
INTRAVENOUS | Status: DC | PRN
  Administered 2022-04-21: 1 g via INTRAVENOUS

## 2022-04-21 SURGICAL SUPPLY — 147 items
ANCHOR CATH FOLEY SECURE (MISCELLANEOUS) ×10 IMPLANT
APPLICATOR TIP COSEAL (VASCULAR PRODUCTS) IMPLANT
APPLIER CLIP 13 LRG OPEN (CLIP) ×5
BAG DECANTER FOR FLEXI CONT (MISCELLANEOUS) ×5 IMPLANT
BLADE CLIPPER SURG (BLADE) ×10 IMPLANT
BLADE STERNUM SYSTEM 6 (BLADE) ×6 IMPLANT
BLADE SURG 11 STRL SS (BLADE) ×5 IMPLANT
BRUSH SCRUB EZ PLAIN DRY (MISCELLANEOUS) ×6 IMPLANT
CANNULA EZ GLIDE AORTIC 21FR (CANNULA) ×5 IMPLANT
CANNULA FEM BIOMEDICUS 25FR (CANNULA) ×5 IMPLANT
CANNULA FEMORAL ART 14 SM (MISCELLANEOUS) ×5 IMPLANT
CATH ACCU-VU SIZ PIG 5F 100CM (CATHETERS) ×2 IMPLANT
CATH DIAG EXPO 6F AL1 (CATHETERS) ×5 IMPLANT
CATH DIAG EXPO 6F FR4 (CATHETERS) ×1 IMPLANT
CATH EMB 4FR 40CM (CATHETERS) ×1 IMPLANT
CATH INFINITI 6F MPB2 (CATHETERS) ×5 IMPLANT
CATH ROBINSON RED A/P 18FR (CATHETERS) ×1 IMPLANT
CATH THORACIC 28FR (CATHETERS) ×2 IMPLANT
CLIP APPLIE 13 LRG OPEN (CLIP) ×4 IMPLANT
CLIP LIGATING EXTRA MED SLVR (CLIP) ×1 IMPLANT
CLIP LIGATING EXTRA SM BLUE (MISCELLANEOUS) ×1 IMPLANT
CLIP TI MEDIUM 24 (CLIP) ×3 IMPLANT
CLIP TI WIDE RED SMALL 24 (CLIP) ×2 IMPLANT
CLIP VESOCCLUDE LG 6/CT (CLIP) ×5 IMPLANT
CLIP VESOCCLUDE MED 24/CT (CLIP) ×5 IMPLANT
CLIP VESOCCLUDE SM WIDE 24/CT (CLIP) ×5 IMPLANT
CONN 3/8X3/8 GISH STERILE (MISCELLANEOUS) ×5 IMPLANT
CONTAINER PROTECT SURGISLUSH (MISCELLANEOUS) ×16 IMPLANT
COUNTER NEEDLE 20 DBL MAG RED (NEEDLE) ×1 IMPLANT
COVER BACK TABLE 60X90IN (DRAPES) ×5 IMPLANT
COVER MAYO STAND STRL (DRAPES) ×1 IMPLANT
COVER PROBE W GEL 5X96 (DRAPES) ×5 IMPLANT
COVER SURGICAL LIGHT HANDLE (MISCELLANEOUS) ×5 IMPLANT
DRAIN CHANNEL 15F RND FF W/TCR (WOUND CARE) ×2 IMPLANT
DRAPE C-ARM 42X72 X-RAY (DRAPES) ×16 IMPLANT
DRAPE CARDIOVASCULAR INCISE (DRAPES) ×5
DRAPE CV SPLIT W-CLR ANES SCRN (DRAPES) ×5 IMPLANT
DRAPE INCISE IOBAN 66X45 STRL (DRAPES) ×5 IMPLANT
DRAPE PERI GROIN 82X75IN TIB (DRAPES) ×5 IMPLANT
DRAPE SLUSH/WARMER DISC (DRAPES) ×8 IMPLANT
DRAPE SRG 135X102X78XABS (DRAPES) ×4 IMPLANT
DRESSING PEEL AND PLC PRVNA 13 (GAUZE/BANDAGES/DRESSINGS) IMPLANT
DRSG PEEL AND PLACE PREVENA 13 (GAUZE/BANDAGES/DRESSINGS) ×10
DRSG TEGADERM 4X4.5 CHG (GAUZE/BANDAGES/DRESSINGS) ×5 IMPLANT
DRSG XEROFORM 1X8 (GAUZE/BANDAGES/DRESSINGS) ×1 IMPLANT
ELECT BLADE 4.0 EZ CLEAN MEGAD (MISCELLANEOUS) ×10
ELECT CAUTERY BLADE 6.4 (BLADE) ×5 IMPLANT
ELECT REM PT RETURN 9FT ADLT (ELECTROSURGICAL) ×5
ELECTRODE BLDE 4.0 EZ CLN MEGD (MISCELLANEOUS) ×4 IMPLANT
ELECTRODE REM PT RTRN 9FT ADLT (ELECTROSURGICAL) ×4 IMPLANT
EVACUATOR SILICONE 100CC (DRAIN) ×2 IMPLANT
FELT TEFLON 1X6 (MISCELLANEOUS) ×5 IMPLANT
FEMORAL VENOUS CANN RAP (CANNULA) ×5 IMPLANT
GAUZE 4X4 16PLY ~~LOC~~+RFID DBL (SPONGE) ×10 IMPLANT
GAUZE SPONGE 4X4 12PLY STRL (GAUZE/BANDAGES/DRESSINGS) ×6 IMPLANT
GAUZE SPONGE 4X4 12PLY STRL LF (GAUZE/BANDAGES/DRESSINGS) ×5 IMPLANT
GLOVE BIO SURGEON STRL SZ 6 (GLOVE) ×4 IMPLANT
GLOVE BIO SURGEON STRL SZ 6.5 (GLOVE) ×9 IMPLANT
GLOVE BIO SURGEON STRL SZ7 (GLOVE) ×4 IMPLANT
GLOVE BIOGEL M STRL SZ7.5 (GLOVE) ×15 IMPLANT
GLOVE BIOGEL PI IND STRL 6.5 (GLOVE) IMPLANT
GLOVE BIOGEL PI IND STRL 7.0 (GLOVE) IMPLANT
GLOVE BIOGEL PI IND STRL 7.5 (GLOVE) IMPLANT
GLOVE BIOGEL PI INDICATOR 6.5 (GLOVE) ×4
GLOVE BIOGEL PI INDICATOR 7.0 (GLOVE) ×4
GLOVE BIOGEL PI INDICATOR 7.5 (GLOVE) ×4
GLOVE NEODERM STRL 7.5  LF PF (GLOVE) ×4
GLOVE NEODERM STRL 7.5 LF PF (GLOVE) ×4 IMPLANT
GLOVE SS BIOGEL STRL SZ 6 (GLOVE) IMPLANT
GLOVE SS BIOGEL STRL SZ 6.5 (GLOVE) IMPLANT
GLOVE SS BIOGEL STRL SZ 7.5 (GLOVE) IMPLANT
GLOVE SUPERSENSE BIOGEL SZ 6 (GLOVE) ×4
GLOVE SUPERSENSE BIOGEL SZ 6.5 (GLOVE) ×4
GLOVE SUPERSENSE BIOGEL SZ 7.5 (GLOVE) ×3
GLOVE SURG NEODERM 7.5  LF PF (GLOVE) ×1
GOWN STRL REUS W/ TWL LRG LVL3 (GOWN DISPOSABLE) ×32 IMPLANT
GOWN STRL REUS W/TWL LRG LVL3 (GOWN DISPOSABLE) ×40
GRAFT GELWEAVE IMPREG 10X30CM (Prosthesis & Implant Heart) ×1 IMPLANT
HEMOSTAT SURGICEL 2X14 (HEMOSTASIS) ×1 IMPLANT
HEMOSTAT SURGICEL 2X4 FIBR (HEMOSTASIS) ×5 IMPLANT
INSERT FOGARTY SM (MISCELLANEOUS) ×12 IMPLANT
KIT BASIN OR (CUSTOM PROCEDURE TRAY) ×10 IMPLANT
KIT CATH LUMEN 2 8FR 16CM STRL (SET/KITS/TRAYS/PACK) ×5 IMPLANT
KIT CATH LUMEN 2 9FR 11.5CM ST (CATHETERS) ×5 IMPLANT
KIT DILATOR VASC 18G NDL (KITS) ×5 IMPLANT
KIT DRSG PREVENA PLUS 7DAY 125 (MISCELLANEOUS) ×2 IMPLANT
KIT SUCTION CATH 14FR (SUCTIONS) ×1 IMPLANT
LINE SUCTION ATS CATS PLUS (LINER) ×1 IMPLANT
LOOP VESSEL MAXI BLUE (MISCELLANEOUS) ×1 IMPLANT
LOOP VESSEL MAXI BLUE 1X16 (MISCELLANEOUS) ×2 IMPLANT
LOOP VESSEL MINI RED (MISCELLANEOUS) ×6 IMPLANT
LOOP VESSEL SUPERMAXI WHITE (MISCELLANEOUS) ×2 IMPLANT
NS IRRIG 1000ML POUR BTL (IV SOLUTION) ×25 IMPLANT
PACK CHEST (CUSTOM PROCEDURE TRAY) ×9 IMPLANT
PAD ARMBOARD 7.5X6 YLW CONV (MISCELLANEOUS) ×15 IMPLANT
PAD ELECT DEFIB RADIOL ZOLL (MISCELLANEOUS) ×6 IMPLANT
POWDER SURGICEL 3.0 GRAM (HEMOSTASIS) ×2 IMPLANT
PUMP SET IMPELLA 5.5 US (CATHETERS) ×6 IMPLANT
SEALANT SURG COSEAL 4ML (VASCULAR PRODUCTS) ×5 IMPLANT
SEALANT SURG COSEAL 8ML (VASCULAR PRODUCTS) ×4 IMPLANT
SET CANNULATION TOURNIQUET (MISCELLANEOUS) ×5 IMPLANT
SET PRESSURE INFUSION 24 (MISCELLANEOUS) ×5 IMPLANT
SHEATH AVANTI 11CM 5FR (SHEATH) ×5 IMPLANT
SOL PREP POV-IOD 4OZ 10% (MISCELLANEOUS) ×5 IMPLANT
SOL SCRUB PVP POV-IOD 4OZ 7.5% (MISCELLANEOUS) ×5
SOLUTION SCRB POV-IOD 4OZ 7.5% (MISCELLANEOUS) ×4 IMPLANT
SPONGE INTESTINAL PEANUT (DISPOSABLE) ×1 IMPLANT
SPONGE T-LAP 18X18 ~~LOC~~+RFID (SPONGE) ×46 IMPLANT
SPONGE T-LAP 4X18 ~~LOC~~+RFID (SPONGE) ×12 IMPLANT
STAPLER VISISTAT 35W (STAPLE) ×6 IMPLANT
SUT ETHILON 3 0 PS 1 (SUTURE) ×12 IMPLANT
SUT PROLENE 3 0 SH DA (SUTURE) ×1 IMPLANT
SUT PROLENE 3 0 SH1 36 (SUTURE) ×1 IMPLANT
SUT PROLENE 5 0 C 1 24 (SUTURE) ×18 IMPLANT
SUT PROLENE 5 0 C 1 36 (SUTURE) ×12 IMPLANT
SUT PROLENE 6 0 C 1 30 (SUTURE) ×5 IMPLANT
SUT PROLENE 6 0 CC (SUTURE) ×4 IMPLANT
SUT SILK  1 MH (SUTURE) ×65
SUT SILK 1 MH (SUTURE) ×40 IMPLANT
SUT SILK 1 TIES 10X30 (SUTURE) ×5 IMPLANT
SUT SILK 2 0 (SUTURE) ×5
SUT SILK 2 0 SH CR/8 (SUTURE) ×7 IMPLANT
SUT SILK 2 0 TIES 10X30 (SUTURE) ×1 IMPLANT
SUT SILK 2-0 18XBRD TIE 12 (SUTURE) IMPLANT
SUT SILK 3 0 (SUTURE) ×5
SUT SILK 3 0 SH CR/8 (SUTURE) ×1 IMPLANT
SUT SILK 3-0 18XBRD TIE 12 (SUTURE) IMPLANT
SUT SILK 4 0 (SUTURE) ×5
SUT SILK 4-0 18XBRD TIE 12 (SUTURE) IMPLANT
SUT VIC AB 1 CTX 18 (SUTURE) ×1 IMPLANT
SUT VIC AB 2-0 CT1 27 (SUTURE) ×25
SUT VIC AB 2-0 CT1 TAPERPNT 27 (SUTURE) IMPLANT
SUT VIC AB 3-0 X1 27 (SUTURE) ×1 IMPLANT
SYR 10ML LL (SYRINGE) ×5 IMPLANT
SYR 30ML LL (SYRINGE) ×5 IMPLANT
SYR 3ML LL SCALE MARK (SYRINGE) ×1 IMPLANT
SYSTEM SAHARA CHEST DRAIN ATS (WOUND CARE) ×2 IMPLANT
TAPE CLOTH SURG 4X10 WHT LF (GAUZE/BANDAGES/DRESSINGS) ×6 IMPLANT
TOWEL GREEN STERILE (TOWEL DISPOSABLE) ×10 IMPLANT
TOWEL GREEN STERILE FF (TOWEL DISPOSABLE) ×10 IMPLANT
TRAY FOLEY MTR SLVR 16FR STAT (SET/KITS/TRAYS/PACK) ×5 IMPLANT
TRAY FOLEY SLVR 16FR TEMP STAT (SET/KITS/TRAYS/PACK) ×5 IMPLANT
WATER STERILE IRR 1000ML POUR (IV SOLUTION) ×15 IMPLANT
WIRE EMERALD 3MM-J .035X260CM (WIRE) ×3 IMPLANT
WIRE J 3MM .035X145CM (WIRE) ×5 IMPLANT
WIRE ROSEN-J .035X260CM (WIRE) ×1 IMPLANT
YANKAUER SUCT BULB TIP NO VENT (SUCTIONS) ×2 IMPLANT

## 2022-04-21 NOTE — Progress Notes (Signed)
NAME:  James White, MRN:  [id number], DOB:  08/16/1961, LOS: 4 ADMISSION DATE:  04/20/2022, CONSULTATION DATE:  04/13/2022 REFERRING MD:  Particia Nearing, CHIEF COMPLAINT:  syncope, HG   History of Present Illness:  James White is a 61 y.o. M with PMH of HTN and HL who presented to the ED with a syncopal episode while at work.  History taken from epic and patient was intubated.  He noted feeling poorly for several days prior and thought this was secondary to the head.  He went to work this morning and became diaphoretic and passed out.  On EMS arrival he was hypotensive and bradycardic as low as 29, EKG concerning for complete heart bloack.  He was intubated in the ED. Labs significant for trop of 99, creatinine 1.6, K 3.6, glucose >500 initially.  He was externally paced, then in intermittent atrial fib.  and seen by EP, plan for ICU admission and temp pacer with L and R heart cath.  PCCM consulted for ventilator management  Pertinent  Medical History   has a past medical history of High cholesterol and Hypertension.   Significant Hospital Events: Including procedures, antibiotic start and stop dates in addition to other pertinent events   7/10 Syncopal episode, bradycardia and complete HB, intubated, plan for temp pacer and cath lab, PCCM consult 7/10 DES to LAD, CX lesion. RCA patent.  7/10 brief arrest, temporary wire, VA ECMO via bifemoral cannulation, CP Impella to vent.  7/10 bleeding from left arterial cannulation site.  Bolsters placed by VVS 7/11 Improving pulsatility and oxygenation.   Interim History / Subjective:   Starting to wake up spontaneously, more overnight with associated ventilator dyssynchrony.  Objective   Blood pressure (!) 86/68, pulse 80, temperature 97.7 F (36.5 C), resp. rate 16, height 5\' 8"  (1.727 m), weight 124.8 kg, SpO2 100 %. CVP:  [13 mmHg-34 mmHg] 17 mmHg  Vent Mode: PCV FiO2 (%):  [40 %-50 %] 40 % Set Rate:  [16 bmp] 16 bmp PEEP:  [10 cmH20] 10  cmH20 Plateau Pressure:  [21 cmH20-24 cmH20] 23 cmH20   Intake/Output Summary (Last 24 hours) at 05/05/2022 0814 Last data filed at 04/15/2022 0700 Gross per 24 hour  Intake 4084.53 ml  Output 3746 ml  Net 338.53 ml    Filed Weights   04/19/22 0500 04/20/22 0500 04/17/2022 0643  Weight: 127.8 kg 117.7 kg 124.8 kg    General:  critically ill-appearing M, intubated and sedated  HEENT: + facial swelling with scleral edema. ETT, OGT in place. Swelling improving.  Neuro: Sedated,  will frown to eye opening. Will move all limbs.  CV: VA ECMO 3.7 Lpm , Impella P1, currently on E, NE with 04/23/22 of pulsatility. Extremities warm.  Sweep at 4.  PULM:  PC15/10 Vt 360 and Pplat 25. Crackles a both bases.  GI: soft, obese, soft, no bowel sounds.  Extremities: + anasarca. Line sites intact. No bleeding from sites.  Skin: left flank is soft.   Ancillary tests personally reviewed:   7.440/44.2/362 LFT's continues to improved.  HB 6.8 PLT 73 Assessment & Plan:   Cardiogenic shock (HCC) Following STEMI. Now requiring ECMO support. Impella for LV venting. Improving LV function by echo, but did not tolerate transition to Impella support.   -Continue ECMO support and monitor for LV recovery.  - Wean E/NE to keep MAP >70 and maintain 10 of pulsatility - Improving pulsatility - Echo shows EF 30%  - Will re-visit decannulation on 7/17  Complete  heart block (HCC) TVPM set at 80 and requiring intermittent pacing with good capture.  Underlying rhythm is SR at 75.  - Allow native heart rate as will improve ventricular synchrony.   CAD (coronary artery disease) On Lipitor 80 ASA and Brillinta for stent.  Cardiac arrest (HCC)  Unable to fully assess due to sedation, but will move all limbs spontaneously.  - Slow sedation wean to obtain more complete exam, and ideally get him to follow commands.    Acute on chronic respiratory failure with hypoxia and hypercapnia (HCC) On VA ECMO support.   Sedated for dyssynchrony last night.  More comfortable on PRVC  - Maintain current ventilator settings. Oxygenation should improve with fluid removal.  - Will likely be able to come off VA soon and transition to Impella alone.   Acute kidney injury (AKI) with acute tubular necrosis (ATN) (HCC) Minimal urine output. Increased vasopressor requirements yesterday, which improved with less aggressive fluid removal  Remains on CRRT.  - Gentle fluid removal as tolerated.  - Normal bicarb bath.   Hemorrhage due to vascular catheter (HCC) No bleeding from site.   - Monitor for further bleeding - Vascular surgery to close when ready to come off.   STEMI (ST elevation myocardial infarction) (HCC) S/P DES to LAD.  - Continue to monitor for LV recovery.  - Does better with better pulsatility with MAP around 70 likely due to better coronary perfusion   Thrombocytopenia Elevated LDH may be partially from MI No clot on circuit visible  -Free HB and haptoglobin to rule out hemolysis.    Best Practice (right click and "Reselect all SmartList Selections" daily)   Diet/type: NPO Start trickle feeds today  DVT prophylaxis: Systemic bivalirudin GI prophylaxis: PPI Lines: Right TVPM, L IJ TLC, L [place] HD catheter, RF CP, RF drainage, LF return.  Foley:  Yes, and it is still needed Code Status:  full code Last date of multidisciplinary goals of care discussion [per primary]  CRITICAL CARE Performed by: Lynnell Catalan  Total critical care time: 50 minutes  Critical care time was exclusive of separately billable procedures and treating other patients.  Critical care was necessary to treat or prevent imminent or life-threatening deterioration.  Critical care was time spent personally by me on the following activities: development of treatment plan with patient and/or surrogate as well as nursing, discussions with consultants, evaluation of patient's response to treatment, examination of patient,  obtaining history from patient or surrogate, ordering and performing treatments and interventions, ordering and review of laboratory studies, ordering and review of radiographic studies, pulse oximetry and re-evaluation of patient's condition.  Lynnell Catalan, MD [facility] ICU Physician [facility] [place] Critical [facility]  Pager: [phone] Or Epic Secure Chat After hours: [phone].  04/29/2022, 8:14 AM

## 2022-04-21 NOTE — Progress Notes (Signed)
Patient decannulated from [place] ECMO at 2137 with bridge to impella 5.5. ECMO Specialist x 2 and perfusionist present during procedure.

## 2022-04-21 NOTE — Plan of Care (Signed)
  Problem: Education: Goal: Understanding of CV disease, CV risk reduction, and recovery process will improve Outcome: Progressing Goal: Individualized Educational Video(s) Outcome: Progressing   Problem: Activity: Goal: Ability to return to baseline activity level will improve Outcome: Progressing   Problem: Cardiovascular: Goal: Ability to achieve and maintain adequate cardiovascular perfusion will improve Outcome: Progressing Goal: Vascular access site(s) Level 0-1 will be maintained Outcome: Progressing   Problem: Health Behavior/Discharge Planning: Goal: Ability to safely manage health-related needs after discharge will improve Outcome: Progressing   Problem: Education: Goal: Ability to describe self-care measures that may prevent or decrease complications (Diabetes Survival Skills Education) will improve Outcome: Progressing Goal: Individualized Educational Video(s) Outcome: Progressing   Problem: Coping: Goal: Ability to adjust to condition or change in health will improve Outcome: Progressing   Problem: Fluid Volume: Goal: Ability to maintain a balanced intake and output will improve Outcome: Progressing   Problem: Health Behavior/Discharge Planning: Goal: Ability to identify and utilize available resources and services will improve Outcome: Progressing Goal: Ability to manage health-related needs will improve Outcome: Progressing   Problem: Metabolic: Goal: Ability to maintain appropriate glucose levels will improve Outcome: Progressing   Problem: Nutritional: Goal: Maintenance of adequate nutrition will improve Outcome: Progressing Goal: Progress toward achieving an optimal weight will improve Outcome: Progressing   Problem: Skin Integrity: Goal: Risk for impaired skin integrity will decrease Outcome: Progressing   Problem: Tissue Perfusion: Goal: Adequacy of tissue perfusion will improve Outcome: Progressing   Problem: Safety: Goal: Non-violent  Restraint(s) Outcome: Progressing   Problem: Education: Goal: Knowledge of General Education information will improve Description: Including pain rating scale, medication(s)/side effects and non-pharmacologic comfort measures Outcome: Progressing   Problem: Health Behavior/Discharge Planning: Goal: Ability to manage health-related needs will improve Outcome: Progressing   Problem: Clinical Measurements: Goal: Ability to maintain clinical measurements within normal limits will improve Outcome: Progressing Goal: Will remain free from infection Outcome: Progressing Goal: Diagnostic test results will improve Outcome: Progressing Goal: Respiratory complications will improve Outcome: Progressing Goal: Cardiovascular complication will be avoided Outcome: Progressing   Problem: Activity: Goal: Risk for activity intolerance will decrease Outcome: Progressing   Problem: Nutrition: Goal: Adequate nutrition will be maintained Outcome: Progressing   Problem: Coping: Goal: Level of anxiety will decrease Outcome: Progressing   Problem: Elimination: Goal: Will not experience complications related to bowel motility Outcome: Progressing Goal: Will not experience complications related to urinary retention Outcome: Progressing   Problem: Pain Managment: Goal: General experience of comfort will improve Outcome: Progressing   Problem: Safety: Goal: Ability to remain free from injury will improve Outcome: Progressing   Problem: Skin Integrity: Goal: Risk for impaired skin integrity will decrease Outcome: Progressing

## 2022-04-21 NOTE — Progress Notes (Signed)
eLink Physician-Brief Progress Note Patient Name: James White DOB: 1961/05/09 MRN: [id number]   Date of Service  May 15, 2022  HPI/Events of Note  Anemia - Hgb = 7.5 -> 6.8.  eICU Interventions  Will transfuse 1 unit PRBC.     Intervention Category Major Interventions: Other:  Lenell Antu 05-15-2022, 4:39 AM

## 2022-04-21 NOTE — Progress Notes (Signed)
James White   Subjective:   Remains critically ill on ecmo.  Discussed with RN.  I/Os 4200 / 4004 - UOP 67mL, UF 4.0L; plans for impella delayed until Monday.   Objective Vitals:   04/29/2022 0643 05/07/2022 0645 04/18/2022 0751 05/01/2022 0756  BP:      Pulse:   80   Resp:  16 16   Temp:  97.7 F (36.5 C)    TempSrc:      SpO2:   100% 100%  Weight: 124.8 kg     Height:       Physical Exam General: intubated, sedated.  Heart: RRR Lungs: coarse Abdomen: soft, mod distended Extremities: trace diffuse edema   Additional Objective Labs: Basic Metabolic Panel: Recent Labs  Lab 04/20/22 0355 04/20/22 0357 04/20/22 1603 04/20/22 1611 04/13/2022 0159 04/18/2022 0256 04/10/2022 0700  NA 138   < > 136  137   < > 135 137 136  K 5.8*   < > 4.9  5.0   < > 4.7 4.4 4.5  CL 105  --  103  104  --   --  102  --   CO2 26  --  24  25  --   --  25  --   GLUCOSE 140*  --  146*  149*  --   --  195*  --   BUN 14  --  16  16  --   --  21  --   CREATININE 2.63*  --  2.52*  2.50*  --   --  2.49*  --   CALCIUM 7.3*  --  7.3*  7.4*  --   --  7.4*  --   PHOS 5.4*  --  4.7*  --   --  3.6  --    < > = values in this interval not displayed.    Liver Function Tests: Recent Labs  Lab 04/19/22 0300 04/19/22 1123 04/20/22 0355 04/20/22 1603 04/16/2022 0256  AST 900*  --  632*  --  498*  ALT 328*  --  295*  --  241*  ALKPHOS 40  --  49  --  54  BILITOT 0.4  --  0.5  --  0.7  PROT 4.4*  --  5.3*  --  5.1*  ALBUMIN 2.6*   < > 2.7* 2.4* 2.4*   < > = values in this interval not displayed.    No results for input(s): "LIPASE", "AMYLASE" in the last 168 hours. CBC: Recent Labs  Lab 05-04-22 0730 05-04-22 1020 04/19/22 1609 04/19/22 1617 04/20/22 0355 04/20/22 0357 04/20/22 1603 04/20/22 1611 04/20/22 2153 04/20/22 2154 05/07/2022 0256 04/25/2022 0604 05/03/2022 0700  WBC 10.2   < > 10.1  --  9.9  --  8.8  --  9.0  --  7.7  --   --   NEUTROABS 5.4  --   --   --    --   --   --   --   --   --   --   --   --   HGB 13.0   < > 8.3*   < > 8.0*   < > 7.4*   < > 7.6*   < > 6.8* 7.2* 6.8*  HCT 38.2*   < > 24.7*   < > 24.5*   < > 23.4*   < > 23.9*   < > 21.8* 22.4* 20.0*  MCV 92.7   < >  89.5  --  92.1  --  94.7  --  95.2  --  96.0  --   --   PLT 276   < > 91*  --  89*  --  73*  --  76*  --  73*  --   --    < > = values in this interval not displayed.    Blood Culture No results found for: "SDES", "SPECREQUEST", "CULT", "REPTSTATUS"  Cardiac Enzymes: No results for input(s): "CKTOTAL", "CKMB", "CKMBINDEX", "TROPONINI" in the last 168 hours. CBG: Recent Labs  Lab 04/20/22 1159 04/20/22 1609 04/20/22 2018 05/03/2022 0010 04/08/2022 0300  GLUCAP 135* 144* 145* 221* 192*    Iron Studies: No results for input(s): "IRON", "TIBC", "TRANSFERRIN", "FERRITIN" in the last 72 hours. @lablastinr3 @ Studies/Results: DG CHEST PORT 1 VIEW  Result Date: 04/30/2022 CLINICAL DATA:  [id number]; ETT/on ECMO EXAM: PORTABLE CHEST 1 VIEW COMPARISON:  None Available. FINDINGS: Endotracheal tube, feeding tube, right transjugular catheter with its tip in the right heart, 2 left transjugular catheters in the upper SVC, and aortic Impella device are stable. Cardiomegaly and widening of the mediastinum, stable. Low lung volume. Lungs remain clear. IMPRESSION: 1.  Stable supporting apparatus. 2.  Cardiomegaly and widening of the mediastinum, stable. 3.  Low lung volumes.  Lungs remain clear. Electronically Signed   By: Frazier Richards M.D.   On: 05/02/2022 08:04   DG Abd 1 View  Result Date: 04/20/2022 CLINICAL DATA:  Abdominal distension. EXAM: ABDOMEN - 1 VIEW COMPARISON:  04/19/2012 FINDINGS: A feeding tube is again seen with the tip in the gastric antrum. The bowel gas pattern is normal. Bilateral femoral catheters are also noted. Probable also noted within the bladder. IMPRESSION: Feeding tube tip in the gastric antrum. Normal bowel gas pattern. Electronically Signed   By: Marlaine Hind  M.D.   On: 04/20/2022 18:19   ECHOCARDIOGRAM LIMITED  Result Date: 04/20/2022    ECHOCARDIOGRAM LIMITED REPORT   Patient Name:   James White Date of Exam: 04/20/2022 Medical Rec #:  [id number]   Height:       68.0 in Accession #:    [id number]  Weight:       259.5 lb Date of Birth:  01-13-1961   BSA:          2.283 m Patient Age:    61 years    BP:           77/51 mmHg Patient Gender: M           HR:           82 bpm. Exam Location:  Inpatient Procedure: Limited Echo and Limited Color Doppler Indications:    Cardiomyopathy-Ischemic I25.5  History:        Patient has prior history of Echocardiogram examinations, most                 recent 04/18/2022. Risk Factors:Hypertension.  Sonographer:    Bernadene Person RDCS Referring Phys: [id number] [place]  1. Impella in stable position in LV. Left ventricular ejection fraction, by estimation, is 40 to 45%. The left ventricle has mildly decreased function. The left ventricle demonstrates global hypokinesis. There is moderate concentric left ventricular hypertrophy.  2. Right ventricular systolic function is moderately reduced. The right ventricular size is normal. Tricuspid regurgitation signal is inadequate for assessing PA pressure.  3. The mitral valve is abnormal. Mild to moderate mitral valve regurgitation. No evidence of mitral stenosis.  4. The inferior vena  cava is dilated in size with <50% respiratory variability, suggesting right atrial pressure of 15 mmHg.  5. Limited echo FINDINGS  Left Ventricle: Impella in stable position in LV. Left ventricular ejection fraction, by estimation, is 40 to 45%. The left ventricle has mildly decreased function. The left ventricle demonstrates global hypokinesis. The left ventricular internal cavity  size was normal in size. There is moderate concentric left ventricular hypertrophy. Right Ventricle: The right ventricular size is normal. Right ventricular systolic function is moderately reduced. Tricuspid  regurgitation signal is inadequate for assessing PA pressure. Pericardium: Trivial pericardial effusion is present. Mitral Valve: The mitral valve is abnormal. Mild to moderate mitral valve regurgitation. No evidence of mitral valve stenosis. Pulmonic Valve: The pulmonic valve was normal in structure. Pulmonic valve regurgitation is trivial. Venous: The inferior vena cava is dilated in size with less than 50% respiratory variability, suggesting right atrial pressure of 15 mmHg. [employer] Electronically signed by Wilfred Lacy Signature Date/Time: 04/20/2022/3:53:28 PM    Final    DG CHEST PORT 1 VIEW  Result Date: 04/20/2022 CLINICAL DATA:  Chest tube placement.  ECMO EXAM: PORTABLE CHEST 1 VIEW COMPARISON:  04/19/2022 FINDINGS: Endotracheal tube unchanged. Transjugular catheter in the RIGHT heart. Two LEFT central venous lines in the SVC. Aortic Impella device noted. Widened mediastinum similar prior. Lungs are clear. Low lung volumes. IMPRESSION: 1. Stable support apparatus. 2. No pulmonary edema or pneumothorax. 3. Prominent mediastinal width unchanged. Electronically Signed   By: Genevive Bi M.D.   On: 04/20/2022 08:23   DG Abd Portable 1V  Result Date: 04/19/2022 CLINICAL DATA:  Feeding tube placement. EXAM: PORTABLE ABDOMEN - 1 VIEW COMPARISON:  None Available. FINDINGS: The feeding tube tip is in the antropyloric region of the stomach. IMPRESSION: Feeding tube tip is in the antropyloric region of the stomach. Electronically Signed   By: Rudie Meyer M.D.   On: 04/19/2022 11:23   Medications:  sodium chloride Stopped (2022-05-06 1846)   albumin human Stopped (04/18/22 2028)   bivalirudin (ANGIOMAX) 250 mg in sodium chloride 0.9 % 500 mL (0.5 mg/mL) infusion 0.032 mg/kg/hr (04/27/2022 0700)    ceFAZolin (ANCEF) IV     epinephrine 4 mcg/min (05/08/2022 0700)   feeding supplement (PIVOT 1.5 CAL) Stopped (04/16/2022 0001)   fentaNYL infusion INTRAVENOUS 300 mcg/hr (04/15/2022 0700)   meropenem  (MERREM) IV Stopped (05/07/2022 0546)   midazolam 10 mg/hr (04/25/2022 0700)   norepinephrine (LEVOPHED) Adult infusion 8 mcg/min (04/26/2022 0700)   prismasol BGK 2/2.5 dialysis solution 2,000 mL/hr at 04/09/2022 0248   prismasol BGK 2/2.5 replacement solution 400 mL/hr at 04/18/2022 0507   prismasol BGK 2/2.5 replacement solution 400 mL/hr at 04/20/22 0824   sodium bicarbonate 25 mEq (Impella PURGE) in dextrose 5 % 1000 mL bag     vancomycin     vasopressin 0.04 Units/min (04/20/2022 0642)    amiodarone  200 mg Per Tube BID   aspirin  81 mg Per Tube Daily   atorvastatin  80 mg Per Tube Daily   Chlorhexidine Gluconate Cloth  6 each Topical Daily   clonazePAM  0.5 mg Per Tube BID   feeding supplement (PROSource TF)  90 mL Per Tube QID   insulin aspart  3-9 Units Subcutaneous Q4H   insulin detemir  20 Units Subcutaneous BID   ipratropium-albuterol  3 mL Nebulization Q4H   midodrine  10 mg Per Tube Q8H   multivitamin  1 tablet Per Tube QHS   mouth rinse  15 mL Mouth Rinse  Q2H   oxyCODONE  5 mg Per Tube Q6H   pantoprazole (PROTONIX) IV  40 mg Intravenous QHS   QUEtiapine  50 mg Per Tube BID   sodium chloride flush  10-40 mL Intracatheter Q12H   sodium chloride flush  3 mL Intravenous Q12H   ticagrelor  90 mg Per Tube BID    Assessment/Plan:   # AKI in the setting of cardiogenic shock - CRRT orders are in place  - Tolerating UF - was net even yesterday, trying for more net neg today  #hyperkalemia:  resolved, cont all 2K dialysate   # PEA arrest - post-arrest supportive care per cardiology and critical care   # Complete heart block  - s/p temp pacer   # Cardiogenic shock  - pressors/inotropes per critical care; going for LVAD   # Anemia - acute blood loss  - s/p PRBC's and had bled out from the site of ECMO cannulation.  - PRBC's per primary team  - transfusing this AM   # Acute MI - s/p PCI with cardiology on 7/10   # Acute hypoxic respiratory failure - on vent per critical  care and on ecmo   Jannifer Hick MD 04/28/2022, 8:15 AM  [facility] Pager: [phone]

## 2022-04-21 NOTE — Progress Notes (Signed)
Patient was decannulated from [facility] ECMO but developed profuse bleeding from the venous cannulation site.  Bleeding was eventually controlled by vascular surgery but patient required multiple products and became acidotic and hypoxemic.  He had a PEA arrest with about 4 minutes of CPR.  Stable MAP was re-established and Impella position was re-adjusted post-CPR.  Pressor requirement increased significantly, patient is currently on NE 40, epinephrine 25, dobutamine 7.5, vasopressin 0.04.  Very difficult to ventilate due to poor lung compliance in setting of multiple blood products. NO started and increased to 40 ppm.    We do not have an access site for VV-ECMO (temp wire right IJ, HD catheter and central line at left neck/subclavian, unable to reaccess the groins.  Discussed with team.   Our only viable option at this point is to try to maintain perfusion with Impella and pressor support and attempt to remove fluid excess/clear lungs via restarting CVVH.   CRITICAL CARE Performed by: Marca Ancona  Total critical care time: 60 minutes  Critical care time was exclusive of separately billable procedures and treating other patients.  Critical care was necessary to treat or prevent imminent or life-threatening deterioration.  Critical care was time spent personally by me on the following activities: development of treatment plan with patient and/or surrogate as well as nursing, discussions with consultants, evaluation of patient's response to treatment, examination of patient, obtaining history from patient or surrogate, ordering and performing treatments and interventions, ordering and review of laboratory studies, ordering and review of radiographic studies, pulse oximetry and re-evaluation of patient's condition.  Marca Ancona. 2022/05/05 11:53 PM

## 2022-04-21 NOTE — Progress Notes (Addendum)
Patient ID: James White, male   DOB: 1961/09/19, 61 y.o.   MRN: [id number]  Patient reassessed this afternoon for ECMO weaning.  Vent settings adjusted this morning, sweep now down to 1 with excellent gas exchange.  MAP around 70.  He is on NE 11, epinephrine 4, vasopressin 0.03.  We weaned VA ECMO to 2500 rpm and increased Impella to P5 with echo guidance.  Patient tolerated this well with MAP 65-70.    Discussed with Drs Christianne Dolin, and Donata Clay.  Will take patient to OR, place Impella 5.5.  If patient remains stable, will then decannulate ECMO in OR with vascular surgeon repairing arteriotomy.  Will give 1 unit PRBCs and albumin dose pre-OR.   CRITICAL CARE Performed by: Marca Ancona  Total critical care time: 30 minutes  Critical care time was exclusive of separately billable procedures and treating other patients.  Critical care was necessary to treat or prevent imminent or life-threatening deterioration.  Critical care was time spent personally by me on the following activities: development of treatment plan with patient and/or surrogate as well as nursing, discussions with consultants, evaluation of patient's response to treatment, examination of patient, obtaining history from patient or surrogate, ordering and performing treatments and interventions, ordering and review of laboratory studies, ordering and review of radiographic studies, pulse oximetry and re-evaluation of patient's condition.  Marca Ancona. May 01, 2022 1:04 PM

## 2022-04-21 NOTE — Anesthesia Preprocedure Evaluation (Signed)
Anesthesia Evaluation  Patient identified by MRN, date of birth, ID band Patient unresponsive    Reviewed: Patient's Chart, lab work & pertinent test results, Unable to perform ROS - Chart review only  Airway Mallampati: Intubated  TM Distance: >3 FB Neck ROM: Full    Dental no notable dental hx.    Pulmonary neg pulmonary ROS,    Pulmonary exam normal breath sounds clear to auscultation       Cardiovascular hypertension, + CAD, + Past MI and +CHF  Normal cardiovascular exam Rhythm:Regular Rate:Normal  61 year old previously healthy male presented with acute MI/STEMI and cardiogenic shock.  He underwent emergency cardiac cath with findings of an occluded circumflex not revascularized and subsequent PCI of the left main and LAD and placement of Impella CP assist device.  He sustained cardiac arrest requiring CPR and was placed on [place] ECMO emergently with arterial cannulation left femoral artery venous cannulation right femoral vein with Impella CP previously placed in the left femoral artery.  He has shown improvement in hemodynamics and pulmonary function over the past 48 hours and has been stable during a careful ECMO wean.  Decannulation is planned tomorrow and his cardiologist has recommended transition to an Impella 5.5 temporary LV assist device for treatment of the cardiogenic shock.   The patient is currently systemically anticoagulated with bivalirudin 0.03 mcg/kg/min with PTT goal 60-80 seconds.  His hematocrit is 7.8 and platelet count 89,000.  Chest x-ray is fairly clear and he is on IV antibiotics for possible aspiration pneumonia.  Most recent echocardiogram shows EF 40 to 45% with good function of the inferior and anterior wall, akinesia of the posterior wall. The patient is on Brilinta tablet 90 mg for the LAD and left main PCI Patient is on low-dose epinephrine, moderate dose norepinephrine and vasopressin.  CVP is 14.  RV  function on his post ECMO echoes has been with mild to moderate dysfunction.  Right coronary had a 80% proximal stenosis not treated.   Neuro/Psych negative neurological ROS  negative psych ROS   GI/Hepatic negative GI ROS, Neg liver ROS,   Endo/Other  diabetes  Renal/GU Renal InsufficiencyRenal disease  negative genitourinary   Musculoskeletal negative musculoskeletal ROS (+)   Abdominal   Peds negative pediatric ROS (+)  Hematology  (+) Blood dyscrasia, anemia ,   Anesthesia Other Findings   Reproductive/Obstetrics negative OB ROS                             Anesthesia Physical Anesthesia Plan  ASA: 4  Anesthesia Plan: General   Post-op Pain Management: Minimal or no pain anticipated   Induction: Intravenous  PONV Risk Score and Plan: 2 and Ondansetron and Treatment may vary due to age or medical condition  Airway Management Planned: Oral ETT  Additional Equipment: Arterial line, CVP and TEE  Intra-op Plan:   Post-operative Plan: Post-operative intubation/ventilation  Informed Consent: I have reviewed the patients History and Physical, chart, labs and discussed the procedure including the risks, benefits and alternatives for the proposed anesthesia with the patient or authorized representative who has indicated his/her understanding and acceptance.     Dental advisory given  Plan Discussed with: CRNA and Surgeon  Anesthesia Plan Comments:         Anesthesia Quick Evaluation

## 2022-04-21 NOTE — Progress Notes (Signed)
Pre Procedure note for inpatients:   James White has been scheduled for   Placement of Impella 5.5 assist device Decannulation of ECMO and Impella CP    today. The various methods of treatment have been discussed with the patient. After consideration of the risks, benefits and treatment options the patient has consented to the planned procedure.   The patient has been seen and labs reviewed. There are no changes in the patient's condition to prevent proceeding with the planned procedure today.  Recent labs:  Lab Results  Component Value Date   WBC 7.7 05-19-2022   HGB 8.2 (L) 05/19/2022   HCT 24.0 (L) May 19, 2022   PLT 73 (L) May 19, 2022   GLUCOSE 195 (H) 05/19/22   CHOL 149 04/18/2022   TRIG 191 (H) 04/18/2022   HDL 25 (L) 04/18/2022   LDLCALC 86 04/18/2022   ALT 241 (H) May 19, 2022   AST 498 (H) 19-May-2022   NA 136 05/19/22   K 4.4 19-May-2022   CL 102 May 19, 2022   CREATININE 2.49 (H) 2022/05/19   BUN 21 2022/05/19   CO2 25 05/19/2022   INR 1.4 (H) 05/19/2022   HGBA1C 7.1 (H) 04/18/2022    Lovett Sox, MD 19-May-2022 1:01 PM

## 2022-04-21 NOTE — Brief Op Note (Signed)
05/02/2022 - 05/01/2022  6:56 PM  PATIENT:  James White  61 y.o. male  PRE-OPERATIVE DIAGNOSIS:  Cardiogenic Shock  POST-OPERATIVE DIAGNOSIS:  Cardiogenicshock  PROCEDURE:  placement Impella 5.5 LVAD  SURGEON:  Surgeon(s) and Role:        Lovett Sox, MD - Primary    * Shirlee Latch, Eliot Ford, MD - Assisting  PHYSICIAN ASSISTANT: WAYNE GOLD PA-C  ASSISTANTS: STAFF RNFA   ANESTHESIA:   general  EBL:  300 ml   BLOOD ADMINISTERED: 1 unit  CC PRBC  DRAINS: none   LOCAL MEDICATIONS USED:  NONE  SPECIMEN:  No Specimen  DISPOSITION OF SPECIMEN:  N/A  COUNTS:  YES  TOURNIQUET:  * No tourniquets in log *  DICTATION: .Other Dictation: Dictation Number [id number]  PLAN OF CARE: Admit to inpatient   PATIENT DISPOSITION:  ICU - intubated and critically ill.   Delay start of Pharmacological VTE agent (>24hrs) due to surgical blood loss or risk of bleeding: yes

## 2022-04-21 NOTE — Progress Notes (Addendum)
Patient ID: James White, male   DOB: 1961-02-10, 61 y.o.   MRN: [id number]     Advanced Heart Failure Rounding Note  PCP-Cardiologist: None   Subjective:    7/10: LAD PCI.  Temporary wire placement.  Impella placed. VA ECMO cannulation.   Overnight, patient was breath-stacking with rise in PaCO2.  Sweep increased to 3 and patient was more heavily sedated.  CVVH UF rate cut to maintain MAP.  Currently, stable MAP on NE 8, epinephrine 4, vasopressin 0.04.  CVVH running, I/Os even for the last day with CVP 19.   Cefepime/vancomycin coverage for aspiration PNA.   Hgb 7.2 this morning, getting 1 unit PRBCs.  Left groin site stable without further overt bleeding.   Sedation with Versed/Fentanyl. Patient will move extremities.   NSR on amiodarone po, no further VT.   Bedside echo today: EF 30%, mild RV dysfunction, moderate-severe MR, good position of Impella.   ECMO: 3375 rpm Flow 3.64 Pven -61 DeltaP 19 Sweep 3 ABG 7.44/44/362, O2 sat 99% LDH 1992 => 1902 => 1824 => 1537 Lactate >9 => 7.8 => 3 => 3.5 => 1.7 => 1.6 Vent FiO2 0.5 Bivalirudin gtt with goal PTT 60-80  Impella CP: P1  Flow 0.9 L/min   Objective:   Weight Range: 124.8 kg Body mass index is 41.83 kg/m.   Vital Signs:   Temp:  [97.2 F (36.2 C)-97.7 F (36.5 C)] 97.7 F (36.5 C) (07/14 0645) Pulse Rate:  [79-80] 80 (07/14 0751) Resp:  [0-18] 16 (07/14 0751) BP: (82-140)/(63-100) 86/68 (07/14 0630) SpO2:  [93 %-100 %] 100 % (07/14 0756) Arterial Line BP: (67-115)/(52-77) 74/66 (07/14 0645) FiO2 (%):  [40 %-50 %] 40 % (07/14 0756) Weight:  [124.8 kg] 124.8 kg (07/14 0643) Last BM Date :  (PTA)  Weight change: Filed Weights   04/19/22 0500 04/20/22 0500 05/02/2022 0643  Weight: 127.8 kg 117.7 kg 124.8 kg    Intake/Output:   Intake/Output Summary (Last 24 hours) at 04/23/2022 0810 Last data filed at 05/07/2022 0700 Gross per 24 hour  Intake 4084.53 ml  Output 3746 ml  Net 338.53 ml       Physical Exam    General: Sedated on vent.  Neck: JVP 16+, no thyromegaly or thyroid nodule.  Lungs: Decreased at bases.  CV: Nondisplaced PMI.  Heart regular S1/S2, no S3/S4, no murmur.  1+ ankle edema.   Abdomen: Soft, nontender, no hepatosplenomegaly, no distention.  Skin: Intact without lesions or rashes.  Neurologic: Will move extremities.  Extremities: No clubbing or cyanosis. DP pulses dopplerable.  HEENT: Normal.    Telemetry   V-paced rate 80, personally reviewed.   Labs    CBC Recent Labs    04/20/22 2153 04/20/22 2154 04/30/2022 0256 04/14/2022 0604 04/11/2022 0700  WBC 9.0  --  7.7  --   --   HGB 7.6*   < > 6.8* 7.2* 6.8*  HCT 23.9*   < > 21.8* 22.4* 20.0*  MCV 95.2  --  96.0  --   --   PLT 76*  --  73*  --   --    < > = values in this interval not displayed.   Basic Metabolic Panel Recent Labs    04/20/22 0355 04/20/22 0357 04/20/22 1603 04/20/22 1611 04/30/2022 0256 04/10/2022 0700  NA 138   < > 136  137   < > 137 136  K 5.8*   < > 4.9  5.0   < > 4.4 4.5  CL 105  --  103  104  --  102  --   CO2 26  --  24  25  --  25  --   GLUCOSE 140*  --  146*  149*  --  195*  --   BUN 14  --  16  16  --  21  --   CREATININE 2.63*  --  2.52*  2.50*  --  2.49*  --   CALCIUM 7.3*  --  7.3*  7.4*  --  7.4*  --   MG 2.8*  --   --   --  2.5*  --   PHOS 5.4*  --  4.7*  --  3.6  --    < > = values in this interval not displayed.   Liver Function Tests Recent Labs    04/20/22 0355 04/20/22 1603 04/12/2022 0256  AST 632*  --  498*  ALT 295*  --  241*  ALKPHOS 49  --  54  BILITOT 0.5  --  0.7  PROT 5.3*  --  5.1*  ALBUMIN 2.7* 2.4* 2.4*   No results for input(s): "LIPASE", "AMYLASE" in the last 72 hours. Cardiac Enzymes No results for input(s): "CKTOTAL", "CKMB", "CKMBINDEX", "TROPONINI" in the last 72 hours.  BNP: BNP (last 3 results) No results for input(s): "BNP" in the last 8760 hours.  ProBNP (last 3 results) No results for input(s):  "PROBNP" in the last 8760 hours.   D-Dimer No results for input(s): "DDIMER" in the last 72 hours. Hemoglobin A1C No results for input(s): "HGBA1C" in the last 72 hours.  Fasting Lipid Panel No results for input(s): "CHOL", "HDL", "LDLCALC", "TRIG", "CHOLHDL", "LDLDIRECT" in the last 72 hours.  Thyroid Function Tests No results for input(s): "TSH", "T4TOTAL", "T3FREE", "THYROIDAB" in the last 72 hours.  Invalid input(s): "FREET3"  Other results:   Imaging    DG CHEST PORT 1 VIEW  Result Date: 04/13/2022 CLINICAL DATA:  [id number]; ETT/on ECMO EXAM: PORTABLE CHEST 1 VIEW COMPARISON:  None Available. FINDINGS: Endotracheal tube, feeding tube, right transjugular catheter with its tip in the right heart, 2 left transjugular catheters in the upper SVC, and aortic Impella device are stable. Cardiomegaly and widening of the mediastinum, stable. Low lung volume. Lungs remain clear. IMPRESSION: 1.  Stable supporting apparatus. 2.  Cardiomegaly and widening of the mediastinum, stable. 3.  Low lung volumes.  Lungs remain clear. Electronically Signed   By: Frazier Richards M.D.   On: 04/09/2022 08:04   DG Abd 1 View  Result Date: 04/20/2022 CLINICAL DATA:  Abdominal distension. EXAM: ABDOMEN - 1 VIEW COMPARISON:  04/19/2012 FINDINGS: A feeding tube is again seen with the tip in the gastric antrum. The bowel gas pattern is normal. Bilateral femoral catheters are also noted. Probable also noted within the bladder. IMPRESSION: Feeding tube tip in the gastric antrum. Normal bowel gas pattern. Electronically Signed   By: Marlaine Hind M.D.   On: 04/20/2022 18:19   ECHOCARDIOGRAM LIMITED  Result Date: 04/20/2022    ECHOCARDIOGRAM LIMITED REPORT   Patient Name:   James White Date of Exam: 04/20/2022 Medical Rec #:  [id number]   Height:       68.0 in Accession #:    [id number]  Weight:       259.5 lb Date of Birth:  [birth year]   BSA:          2.283 m Patient Age:    64 years    BP:  77/51 mmHg Patient  Gender: M           HR:           82 bpm. Exam Location:  Inpatient Procedure: Limited Echo and Limited Color Doppler Indications:    Cardiomyopathy-Ischemic I25.5  History:        Patient has prior history of Echocardiogram examinations, most                 recent 04/18/2022. Risk Factors:Hypertension.  Sonographer:    Eulah Pont RDCS Referring Phys: [id number] RAVI AGARWALA IMPRESSIONS  1. Impella in stable position in LV. Left ventricular ejection fraction, by estimation, is 40 to 45%. The left ventricle has mildly decreased function. The left ventricle demonstrates global hypokinesis. There is moderate concentric left ventricular hypertrophy.  2. Right ventricular systolic function is moderately reduced. The right ventricular size is normal. Tricuspid regurgitation signal is inadequate for assessing PA pressure.  3. The mitral valve is abnormal. Mild to moderate mitral valve regurgitation. No evidence of mitral stenosis.  4. The inferior vena cava is dilated in size with <50% respiratory variability, suggesting right atrial pressure of 15 mmHg.  5. Limited echo FINDINGS  Left Ventricle: Impella in stable position in LV. Left ventricular ejection fraction, by estimation, is 40 to 45%. The left ventricle has mildly decreased function. The left ventricle demonstrates global hypokinesis. The left ventricular internal cavity  size was normal in size. There is moderate concentric left ventricular hypertrophy. Right Ventricle: The right ventricular size is normal. Right ventricular systolic function is moderately reduced. Tricuspid regurgitation signal is inadequate for assessing PA pressure. Pericardium: Trivial pericardial effusion is present. Mitral Valve: The mitral valve is abnormal. Mild to moderate mitral valve regurgitation. No evidence of mitral valve stenosis. Pulmonic Valve: The pulmonic valve was normal in structure. Pulmonic valve regurgitation is trivial. Venous: The inferior vena cava is dilated in size  with less than 50% respiratory variability, suggesting right atrial pressure of 15 mmHg. Doreather Hoxworth [employer] Electronically signed by Wilfred Lacy Signature Date/Time: 04/20/2022/3:53:28 PM    Final      Medications:     Scheduled Medications:  amiodarone  200 mg Per Tube BID   aspirin  81 mg Per Tube Daily   atorvastatin  80 mg Per Tube Daily   Chlorhexidine Gluconate Cloth  6 each Topical Daily   clonazePAM  0.5 mg Per Tube BID   feeding supplement (PROSource TF)  90 mL Per Tube QID   insulin aspart  3-9 Units Subcutaneous Q4H   insulin detemir  20 Units Subcutaneous BID   ipratropium-albuterol  3 mL Nebulization Q4H   midodrine  10 mg Per Tube Q8H   multivitamin  1 tablet Per Tube QHS   mouth rinse  15 mL Mouth Rinse Q2H   oxyCODONE  5 mg Per Tube Q6H   pantoprazole (PROTONIX) IV  40 mg Intravenous QHS   QUEtiapine  50 mg Per Tube BID   sodium chloride flush  10-40 mL Intracatheter Q12H   sodium chloride flush  3 mL Intravenous Q12H   ticagrelor  90 mg Per Tube BID    Infusions:  sodium chloride Stopped (2022/04/24 1846)   albumin human Stopped (04/18/22 2028)   bivalirudin (ANGIOMAX) 250 mg in sodium chloride 0.9 % 500 mL (0.5 mg/mL) infusion 0.032 mg/kg/hr (04/23/2022 0700)    ceFAZolin (ANCEF) IV     epinephrine 4 mcg/min (04/30/2022 0700)   feeding supplement (PIVOT 1.5 CAL) Stopped (04/13/2022 0001)   fentaNYL infusion  INTRAVENOUS 300 mcg/hr (05/06/2022 0700)   meropenem (MERREM) IV Stopped (04/27/2022 0546)   midazolam 10 mg/hr (05/03/2022 0700)   norepinephrine (LEVOPHED) Adult infusion 8 mcg/min (05/06/2022 0700)   prismasol BGK 2/2.5 dialysis solution 2,000 mL/hr at 04/27/2022 0248   prismasol BGK 2/2.5 replacement solution 400 mL/hr at 04/28/2022 0507   prismasol BGK 2/2.5 replacement solution 400 mL/hr at 04/20/22 0824   sodium bicarbonate 25 mEq (Impella PURGE) in dextrose 5 % 1000 mL bag     vancomycin     vasopressin 0.04 Units/min (04/11/2022 0642)    PRN Medications: sodium  chloride, acetaminophen, albumin human, fentaNYL, heparin, midazolam, midazolam, ondansetron (ZOFRAN) IV, mouth rinse, sodium chloride    Assessment/Plan   1. Cardiogenic shock: Ischemic cardiomyopathy.  After PCI to LM/LAD, Impella, and VA ECMO, the LV EF appeared to be around 40% with mild RV systolic dysfunction.  Echo on 7/11 with EF around 35%.  Patient is currently on epinephrine 4, norepinephrine 8, vasopressin 0.04. Impella for LV vent at P1 with flow 0.9 L/min.  VA ECMO ongoing, stable overnight. Lactate normal 1.6.  CVVH ongoing for fluid removal, CVP 19. CXR improved compared to admission. We attempted ECMO wean again this morning to Impella CP with echo guidance (EF appears to be in the 30% range to me with mild RV dysfunction and worse MR, mod-severe range).  MAP dropped and unable to support adequately with Impella alone.  Concern for co-exsting component of distributive shock along with cardiogenic shock.  - Continue VA ECMO support today.  Will adjust vent to try to wean sedation and allow higher MAP.  Will aim for decannulation Monday, discussed with Dr. Donzetta Matters (will need vascular for decannulation).  Dr. Prescott Gum will be available for possible transition to Impella 5.5 with decannulation.  Will not transition to 5.5 today as will use CP as vent only until decannulation.  Would aim for echo-guided trial of ECMO weaning to Impella again Monday morning before going to OR.  - Continue fluid removal via CVVH, gentle 50 cc/hr net.     - Wean pressors as able. Increase midodrine to 15 mg tid.  - Continue Impella P1 as vent. Echo today showed good position.  - Continue bivalirudin gtt for ECMO and Impella, goal PTT 60-80.  2. CAD: Presented with posterior MI and occluded ostial LCx as likely culprit.  Unable to wire the LCx.  Up to 95% stenosis left main into LAD, DES LM to proximal LAD.   - Continue ticagrelor.  - statin.  - ASA 81.  3. VT arrest: In setting of bleeding from left groin.   DCCV x 1 with no CPR.  - Amiodarone po.   4. PEA arrest: Post-PCI.  Required about 2 minutes CPR then resolved with epinephrine/HCO3.  5. Acute hypoxemic respiratory failure: Volume overload/pulmonary edema.  Now managed by VA ECMO.  CXR with pulmonary edema, improved overall.  Vent FiO2 0.5 with sweep up to 3 with vent problems overnight.  - CCM to adjust vent today, aim for weaning back down on Sweep.  - Needs volume removed via CVVH, see above.  - Covering for possible aspiration PNA with vancomycin/cefepime.  6. Arterial cannula site bleeding: Bolsters sutured in place, bleeding stopped.  VVS following.  7. Anemia: Hgb 7.2 this morning.  Transfuse to keep hgb > 8. Getting 1 unit PRBCs today.  Repeat CBC in pm.  8. AKI on ?CKD 3: Creatinine 1.6 at admission, unsure of baseline.  Contrast and hypotension/shock. CVVH started  on 7/10.  - Will need CVVH as above.  9. Thrombocytopenia: Critical illness and blood products.  Platelets lower at 73K today, follow.  - Continue bivalirudin.  10. Type 2 diabetes: SSI.  11. Complete heart block: Has temporary transvenous pacemaker now, likely related to posterior MI.  We turned off pacing this morning, has stable rhythm (looks like NSR with possible 1st degree AVB) in 70s.  - ECG - Leave off pacing as long as HR > 70.  12. FEN: TFs via Cortrack.  13. Elevated LFTs: Suspect shock liver, decreasing.  14. Neuro: Still sedated but will move all extremities.  15. Mitral regurgitation: Moderate-severe on echo today, may be ischemic MR.  Hopefully this will improve when VA ECMO weaned off and afterload decreases.   CRITICAL CARE Performed by: Marca Ancona  Total critical care time: 50 minutes  Critical care time was exclusive of separately billable procedures and treating other patients.  Critical care was necessary to treat or prevent imminent or life-threatening deterioration.  Critical care was time spent personally by me on the following  activities: development of treatment plan with patient and/or surrogate as well as nursing, discussions with consultants, evaluation of patient's response to treatment, examination of patient, obtaining history from patient or surrogate, ordering and performing treatments and interventions, ordering and review of laboratory studies, ordering and review of radiographic studies, pulse oximetry and re-evaluation of patient's condition.     Length of Stay: 4  Marca Ancona, MD  04/23/2022, 8:10 AM  Advanced Heart Failure Team Pager [phone] (M-F; 7a - 5p)  Please contact [facility] for night-coverage after hours (5p -7a ) and weekends on [email or web address]

## 2022-04-21 NOTE — Progress Notes (Signed)
[facility]  Subjective  -remains intubated and critically ill on VA ECMO with Impella support.   Objective (!) 84/70 70 (!) 97.5 F (36.4 C) 20 100%  Intake/Output Summary (Last 24 hours) at 05/01/2022 1145 Last data filed at 05/07/2022 1100 Gross per 24 hour  Intake 4459.11 ml  Output 3236 ml  Net 1223.11 ml    Left femoral arterial ECMO cannula remains hemostatic with bolsters in place Antegrade left SFA sheath   Laboratory Lab Results: Recent Labs    04/20/22 2153 04/20/22 2154 04/13/2022 0256 05/01/2022 0604 04/19/2022 0922 04/10/2022 1019  WBC 9.0  --  7.7  --   --   --   HGB 7.6*   < > 6.8*   < > 6.8* 7.5*  HCT 23.9*   < > 21.8*   < > 20.0* 22.0*  PLT 76*  --  73*  --   --   --    < > = values in this interval not displayed.   BMET Recent Labs    04/20/22 1603 04/20/22 1611 04/20/2022 0256 05/06/2022 0605 04/13/2022 0922 04/12/2022 1019  NA 136  137   < > 137   < > 137 137  K 4.9  5.0   < > 4.4   < > 4.1 4.1  CL 103  104  --  102  --   --   --   CO2 24  25  --  25  --   --   --   GLUCOSE 146*  149*  --  195*  --   --   --   BUN 16  16  --  21  --   --   --   CREATININE 2.52*  2.50*  --  2.49*  --   --   --   CALCIUM 7.3*  7.4*  --  7.4*  --   --   --    < > = values in this interval not displayed.    COAG Lab Results  Component Value Date   INR 1.4 (H) 04/28/2022   INR 1.4 (H) 04/20/2022   INR 1.4 (H) 04/20/2022   No results found for: "PTT"  Assessment/Planning:  61 year old male that was scheduled for ECMO decannulation today in the OR.  Overnight had increasing PCO2 and required increasing ECMO support.  His case was canceled today.  Tentatively been posted for Monday at 730 with Dr. Maren Beach and Dr. Lenell Antu.  This would involve removing the left femoral arterial ECMO cannula with repair of the femoral artery and also removing antegrade SFA sheath.  Cephus Shelling 04/08/2022 11:45 AM --

## 2022-04-21 NOTE — Progress Notes (Signed)
ANTICOAGULATION CONSULT NOTE  Pharmacy Consult for bivalirudin Indication:  ECMO + Impella  No Known Allergies  Patient Measurements: Height: 5\' 8"  (172.7 cm) Weight: 124.8 kg (275 lb 2.2 oz) IBW/kg (Calculated) : 68.4 Heparin Dosing Weight: 93kg  Vital Signs: Temp: 97.7 F (36.5 C) (07/14 0645) Temp Source: Bladder (07/14 0558) BP: 86/68 (07/14 0630) Pulse Rate: 80 (07/14 0558)  Labs: Recent Labs    04/20/22 0355 04/20/22 0357 04/20/22 1603 04/20/22 1611 04/20/22 2153 04/20/22 2154 04/11/2022 0256 04/14/2022 0604 04/30/2022 0700  HGB 8.0*   < > 7.4*   < > 7.6*   < > 6.8* 7.2* 6.8*  HCT 24.5*   < > 23.4*   < > 23.9*   < > 21.8* 22.4* 20.0*  PLT 89*  --  73*  --  76*  --  73*  --   --   APTT 59*  --  63*  --   --   --  62*  --   --   LABPROT 17.1*  --  16.9*  --   --   --  17.4*  --   --   INR 1.4*  --  1.4*  --   --   --  1.4*  --   --   CREATININE 2.63*  --  2.52*  2.50*  --   --   --  2.49*  --   --    < > = values in this interval not displayed.     Estimated Creatinine Clearance: 40.1 mL/min (A) (by C-G formula based on SCr of 2.49 mg/dL (H)).   Medical History: Past Medical History:  Diagnosis Date   High cholesterol    Hypertension      Assessment: 89 yoM admitted with CHB and cardiogenic shock s/p Impella CP and ECMO cannulation. Pt is on bivalirudin for anticoagulation. Will target aPTT 60-80 seconds with Impella placed as well.  aPTT is therapeutic at 62 seconds, Hgb down this am - pRBCs ordered.  Goal of Therapy:  aPTT 60-80 seconds Monitor platelets by anticoagulation protocol: Yes   Plan:  Continue bivalirudin 0.032 mg/kg/hr Continue bicarbonate purge  Check q12h aPTT - 5a/5p   77, PharmD, BCPS, [facility] Clinical Pharmacist [phone] Please check AMION for all [facility] Pharm[facility] numbers 04/14/2022

## 2022-04-21 NOTE — CV Procedure (Signed)
Procedure: Impella 5.5 placement  Indication: Increased LV support to allow VA ECMO weaning.  Sedation: General anesthesia  I was called to the OR to assist Dr. Donata Clay in placement of Impella 5.5 catheter.  Dr. Donata Clay had previously sutured the access graft to the innominate artery.  There was a pre-existing Impella CP catheter.  Initially, we attempted placement with the Impella CP still in the left ventricle.  However, with multiple attempts we were unable to pass the pigtail or JR4 catheter past the Impella CP into the LV.   Therefore, the Impella CP was withdrawn into the descending thoracic aorta.  Thereafter, the pigtail easily entered the LV and the Impella wire was advanced into place over the pigtail.  The Impella 5.5 catheter was then advanced into position over the wire using TEE and fluoroscopic guidance.  There was failure of the optical sensor on Impella 5.5 #1.  Therefore, we elected to replace the device.  Impella 5.5 #1 was withdrawn, the LV was re-entered with the pigtail and the Impella wire placed.  Impella 5.5 #2 was then advanced over the wire into the LV using TEE and fluoroscopic guidance.  Correct position was confirmed.    Dr. Myra Gianotti then arrived to proceed with removal VA ECMO.   Marca Ancona 05-11-2022 7:58 PM

## 2022-04-21 NOTE — Progress Notes (Signed)
Patient ID: DIOR STEPTER, male   DOB: July 21, 1961, 61 y.o.   MRN: [id number] Extracorporeal support note   ECLS support day: 4 Indication: Cardiogenic shock  Configuration: VA ECMO. Distal perfusion right and left SFAs.   Drainage cannula: R femoral vein 23 F Return cannula: L femoral artery 19 F  Pump speed: 3375 rpm Pump flow: 3.64 L/min Cardiohelp  Sweep gas: 3  Circuit check: Stable Anticoagulant: bivalirudin Anticoagulation targets: PTT 60-80  Changes in support: Goal fluid removal. Aim for decannulation on Monday.  Anticipated goals/duration of support: Wean off  Marca Ancona MD 08-May-2022, 8:09 AM

## 2022-04-22 DIAGNOSIS — R570 Cardiogenic shock: Secondary | ICD-10-CM

## 2022-04-22 LAB — BPAM FFP
Blood Product Expiration Date: 202307162359
Blood Product Expiration Date: 202307162359
Blood Product Expiration Date: 202307162359
Blood Product Expiration Date: 202307172359
Blood Product Expiration Date: 202307172359
Blood Product Expiration Date: 202307172359
ISSUE DATE / TIME: 202307142012
ISSUE DATE / TIME: 202307142012
ISSUE DATE / TIME: 202307142156
ISSUE DATE / TIME: 202307142156
ISSUE DATE / TIME: 202307142156
ISSUE DATE / TIME: 202307142156
Unit Type and Rh: 6200
Unit Type and Rh: 6200
Unit Type and Rh: 600
Unit Type and Rh: 6200
Unit Type and Rh: 6200
Unit Type and Rh: 6200

## 2022-04-22 LAB — RENAL FUNCTION PANEL
Albumin: 2.4 g/dL — ABNORMAL LOW (ref 3.5–5.0)
Anion gap: 10 (ref 5–15)
BUN: 28 mg/dL — ABNORMAL HIGH (ref 8–23)
CO2: 32 mmol/L (ref 22–32)
Calcium: 6.6 mg/dL — ABNORMAL LOW (ref 8.9–10.3)
Chloride: 100 mmol/L (ref 98–111)
Creatinine, Ser: 3.53 mg/dL — ABNORMAL HIGH (ref 0.61–1.24)
GFR, Estimated: 19 mL/min — ABNORMAL LOW (ref >60)
Glucose, Bld: 233 mg/dL — ABNORMAL HIGH (ref 70–99)
Phosphorus: >30 mg/dL — ABNORMAL HIGH (ref 2.5–4.6)
Potassium: 4.9 mmol/L (ref 3.5–5.1)
Sodium: 142 mmol/L (ref 135–145)

## 2022-04-22 LAB — BPAM PLATELET PHERESIS
Blood Product Expiration Date: 202307152359
ISSUE DATE / TIME: 202307141953
Unit Type and Rh: 6200

## 2022-04-22 LAB — PREPARE FRESH FROZEN PLASMA
Unit division: 0
Unit division: 0
Unit division: 0
Unit division: 0
Unit division: 0

## 2022-04-22 LAB — POCT I-STAT 7, (LYTES, BLD GAS, ICA,H+H)
Acid-Base Excess: 1 mmol/L (ref 0.0–2.0)
Bicarbonate: 24.5 mmol/L (ref 20.0–28.0)
Calcium, Ion: 1 mmol/L — ABNORMAL LOW (ref 1.15–1.40)
HCT: 24 % — ABNORMAL LOW (ref 39.0–52.0)
Hemoglobin: 8.2 g/dL — ABNORMAL LOW (ref 13.0–17.0)
O2 Saturation: 100 %
Potassium: 4.4 mmol/L (ref 3.5–5.1)
Sodium: 137 mmol/L (ref 135–145)
TCO2: 26 mmol/L (ref 22–32)
pCO2 arterial: 34.7 mmHg (ref 32–48)
pH, Arterial: 7.457 — ABNORMAL HIGH (ref 7.35–7.45)
pO2, Arterial: 353 mmHg — ABNORMAL HIGH (ref 83–108)

## 2022-04-22 LAB — HAPTOGLOBIN: Haptoglobin: 93 mg/dL (ref 32–363)

## 2022-04-22 LAB — CBC
HCT: 32.9 % — ABNORMAL LOW (ref 39.0–52.0)
Hemoglobin: 10.2 g/dL — ABNORMAL LOW (ref 13.0–17.0)
MCH: 28.8 pg (ref 26.0–34.0)
MCHC: 31 g/dL (ref 30.0–36.0)
MCV: 92.9 fL (ref 80.0–100.0)
Platelets: 39 10*3/uL — ABNORMAL LOW (ref 150–400)
RBC: 3.54 MIL/uL — ABNORMAL LOW (ref 4.22–5.81)
RDW: 17.2 % — ABNORMAL HIGH (ref 11.5–15.5)
WBC: 7 10*3/uL (ref 4.0–10.5)
nRBC: 3.6 % — ABNORMAL HIGH (ref 0.0–0.2)

## 2022-04-22 LAB — PREPARE PLATELET PHERESIS: Unit division: 0

## 2022-04-22 MED ORDER — SODIUM CHLORIDE 0.9 % IV SOLN: INTRAVENOUS | Status: DC | PRN

## 2022-04-22 MED ORDER — METHYLENE BLUE 1 % INJ SOLN
0.5000 mg/kg/h | INTRAVENOUS | Status: DC
  Administered 2022-04-22: 0.5 mg/kg/h via INTRAVENOUS
  Filled 2022-04-22 (×2): qty 25

## 2022-04-22 MED ORDER — GLYCOPYRROLATE 0.2 MG/ML IJ SOLN: 0.2000 mg | INTRAMUSCULAR | Status: DC | PRN

## 2022-04-22 MED ORDER — GLYCOPYRROLATE 1 MG PO TABS: 1.0000 mg | ORAL_TABLET | ORAL | Status: DC | PRN

## 2022-04-22 MED ORDER — SODIUM BICARBONATE 8.4 % IV SOLN
INTRAVENOUS | Status: AC
  Filled 2022-04-22: qty 200

## 2022-04-22 MED ORDER — ACETAMINOPHEN 325 MG PO TABS: 650.0000 mg | ORAL_TABLET | Freq: Four times a day (QID) | ORAL | Status: DC | PRN

## 2022-04-22 MED ORDER — MORPHINE BOLUS VIA INFUSION: 5.0000 mg | INTRAVENOUS | Status: DC | PRN

## 2022-04-22 MED ORDER — EPINEPHRINE 1 MG/10ML IJ SOSY
PREFILLED_SYRINGE | INTRAMUSCULAR | Status: AC
  Filled 2022-04-22: qty 40

## 2022-04-22 MED ORDER — EPINEPHRINE 1 MG/10ML IJ SOSY
PREFILLED_SYRINGE | INTRAMUSCULAR | Status: AC
  Filled 2022-04-22: qty 10

## 2022-04-22 MED ORDER — EPINEPHRINE 1 MG/10ML IJ SOSY
PREFILLED_SYRINGE | INTRAMUSCULAR | Status: AC
  Filled 2022-04-22: qty 20

## 2022-04-22 MED ORDER — EPINEPHRINE HCL 5 MG/250ML IV SOLN IN NS
INTRAVENOUS | Status: AC
  Filled 2022-04-22: qty 250

## 2022-04-22 MED ORDER — POLYVINYL ALCOHOL 1.4 % OP SOLN: 1.0000 [drp] | Freq: Four times a day (QID) | OPHTHALMIC | Status: DC | PRN

## 2022-04-22 MED ORDER — MORPHINE 100MG IN NS 100ML (1MG/ML) PREMIX INFUSION
0.0000 mg/h | INTRAVENOUS | Status: DC
  Administered 2022-04-22: 5 mg/h via INTRAVENOUS
  Filled 2022-04-22: qty 100

## 2022-04-22 MED ORDER — ACETAMINOPHEN 650 MG RE SUPP: 650.0000 mg | Freq: Four times a day (QID) | RECTAL | Status: DC | PRN

## 2022-04-22 MED ORDER — SODIUM CHLORIDE 0.9% IV SOLUTION: Freq: Once | INTRAVENOUS | Status: DC

## 2022-04-22 MED ORDER — SODIUM CHLORIDE 0.9 % IV SOLN
INTRAVENOUS | Status: DC
Start: 2022-04-22 — End: 2022-04-22

## 2022-04-23 LAB — BPAM FFP
Blood Product Expiration Date: 202307162359
Blood Product Expiration Date: 202307162359
Blood Product Expiration Date: 202307162359
ISSUE DATE / TIME: 202307142129
ISSUE DATE / TIME: 202307142129
ISSUE DATE / TIME: 202307150050
Unit Type and Rh: 600
Unit Type and Rh: 6200
Unit Type and Rh: 6200

## 2022-04-23 LAB — PREPARE FRESH FROZEN PLASMA
Unit division: 0
Unit division: 0
Unit division: 0

## 2022-04-23 LAB — BPAM PLATELET PHERESIS
Blood Product Expiration Date: 202307152359
ISSUE DATE / TIME: 202307150016
Unit Type and Rh: 6200

## 2022-04-23 LAB — PREPARE PLATELET PHERESIS: Unit division: 0

## 2022-04-23 LAB — CALCIUM, IONIZED: Calcium, Ionized, Serum: 3.1 mg/dL — ABNORMAL LOW (ref 4.5–5.6)

## 2022-04-24 ENCOUNTER — Encounter (HOSPITAL_COMMUNITY): Payer: Self-pay | Admitting: Surgery

## 2022-04-24 LAB — TYPE AND SCREEN
ABO/RH(D): O POS
Antibody Screen: NEGATIVE
Unit division: 0
Unit division: 0
Unit division: 0
Unit division: 0
Unit division: 0
Unit division: 0
Unit division: 0
Unit division: 0
Unit division: 0
Unit division: 0
Unit division: 0
Unit division: 0
Unit division: 0
Unit division: 0
Unit division: 0
Unit division: 0
Unit division: 0
Unit division: 0
Unit division: 0
Unit division: 0
Unit division: 0
Unit division: 0
Unit division: 0
Unit division: 0
Unit division: 0

## 2022-04-24 LAB — BPAM RBC
Blood Product Expiration Date: 202307202359
Blood Product Expiration Date: 202307202359
Blood Product Expiration Date: 202307202359
Blood Product Expiration Date: 202308092359
Blood Product Expiration Date: 202308112359
Blood Product Expiration Date: 202308122359
Blood Product Expiration Date: 202308122359
Blood Product Expiration Date: 202308122359
Blood Product Expiration Date: 202308122359
Blood Product Expiration Date: 202308132359
Blood Product Expiration Date: 202308132359
Blood Product Expiration Date: 202308132359
Blood Product Expiration Date: 202308132359
Blood Product Expiration Date: 202308132359
Blood Product Expiration Date: 202308132359
Blood Product Expiration Date: 202308132359
Blood Product Expiration Date: 202308142359
Blood Product Expiration Date: 202308142359
Blood Product Expiration Date: 202308142359
Blood Product Expiration Date: 202308142359
Blood Product Expiration Date: 202308142359
Blood Product Expiration Date: 202308142359
Blood Product Expiration Date: 202308142359
Blood Product Expiration Date: 202308152359
Blood Product Expiration Date: 202308152359
ISSUE DATE / TIME: 202307101611
ISSUE DATE / TIME: 202307140402
ISSUE DATE / TIME: 202307140901
ISSUE DATE / TIME: 202307141301
ISSUE DATE / TIME: 202307141301
ISSUE DATE / TIME: 202307141301
ISSUE DATE / TIME: 202307141301
ISSUE DATE / TIME: 202307141301
ISSUE DATE / TIME: 202307142000
ISSUE DATE / TIME: 202307142000
ISSUE DATE / TIME: 202307142000
ISSUE DATE / TIME: 202307142000
ISSUE DATE / TIME: 202307142132
ISSUE DATE / TIME: 202307142132
ISSUE DATE / TIME: 202307142132
ISSUE DATE / TIME: 202307142132
ISSUE DATE / TIME: 202307150046
ISSUE DATE / TIME: 202307150046
ISSUE DATE / TIME: 202307151005
ISSUE DATE / TIME: 202307151005
ISSUE DATE / TIME: 202307151048
ISSUE DATE / TIME: 202307151607
Unit Type and Rh: 5100
Unit Type and Rh: 5100
Unit Type and Rh: 5100
Unit Type and Rh: 5100
Unit Type and Rh: 5100
Unit Type and Rh: 5100
Unit Type and Rh: 5100
Unit Type and Rh: 5100
Unit Type and Rh: 5100
Unit Type and Rh: 5100
Unit Type and Rh: 5100
Unit Type and Rh: 5100
Unit Type and Rh: 5100
Unit Type and Rh: 5100
Unit Type and Rh: 5100
Unit Type and Rh: 5100
Unit Type and Rh: 5100
Unit Type and Rh: 5100
Unit Type and Rh: 5100
Unit Type and Rh: 5100
Unit Type and Rh: 5100
Unit Type and Rh: 5100
Unit Type and Rh: 5100
Unit Type and Rh: 5100
Unit Type and Rh: 5100

## 2022-04-24 LAB — HEMOGLOBIN FREE, PLASMA: Hgb, Plasma: 7.5 mg/dL — ABNORMAL HIGH (ref 0.0–4.9)

## 2022-04-24 LAB — POCT I-STAT 7, (LYTES, BLD GAS, ICA,H+H)
Acid-base deficit: 2 mmol/L (ref 0.0–2.0)
Bicarbonate: 23 mmol/L (ref 20.0–28.0)
Calcium, Ion: 1.05 mmol/L — ABNORMAL LOW (ref 1.15–1.40)
HCT: 29 % — ABNORMAL LOW (ref 39.0–52.0)
Hemoglobin: 9.9 g/dL — ABNORMAL LOW (ref 13.0–17.0)
O2 Saturation: 98 %
Patient temperature: 35.7
Potassium: 3.9 mmol/L (ref 3.5–5.1)
Sodium: 139 mmol/L (ref 135–145)
TCO2: 24 mmol/L (ref 22–32)
pCO2 arterial: 38.4 mmHg (ref 32–48)
pH, Arterial: 7.379 (ref 7.35–7.45)
pO2, Arterial: 94 mmHg (ref 83–108)

## 2022-04-24 MED FILL — Fentanyl Citrate Preservative Free (PF) Inj 2500 MCG/50ML: INTRAMUSCULAR | Qty: 50 | Status: AC

## 2022-04-24 MED FILL — Sodium Chloride IV Soln 0.9%: INTRAVENOUS | Qty: 250 | Status: AC

## 2022-04-24 NOTE — Addendum Note (Signed)
Addendum  created 04/24/22 1208 by Adair Laundry, CRNA   Order list changed, Pharmacy for encounter modified

## 2022-04-25 MED FILL — Sodium Chloride IV Soln 0.9%: INTRAVENOUS | Qty: 6000 | Status: AC

## 2022-04-25 MED FILL — Heparin Sodium (Porcine) Inj 1000 Unit/ML: INTRAMUSCULAR | Qty: 60 | Status: AC

## 2022-05-09 NOTE — Significant Event (Signed)
I was called to the patient's bedside for refractory shock. 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 His status was "partial code" at the time of my arrival with permission to administer ACLS medications if indicated, but no shocks or compressions. He had been receiving frequent Epi and Bicarb pushes, but sadly remained in shock. I spoke with the patient's family who agreed to make the patient full DNAR, but not comfort care. His code status was changed.

## 2022-05-09 NOTE — Anesthesia Postprocedure Evaluation (Signed)
Anesthesia Post Note  Patient: James White  Procedure(s) Performed: DECANNULATION FOR  ECMO LEFT COMMON FEMORAL ARTERY REPAIR (Left: Groin) REMOVAL OF IMPELLA CP LEFT VENTRICULAR ASSIST DEVICE PLACEMENT OF IMPELLA  5.5 LEFT VENTRICULAR ASSIST DEVICE (Right: Chest) TRANSESOPHAGEAL ECHOCARDIOGRAM (TEE) (Esophagus)     Patient location during evaluation: SICU Anesthesia Type: General Level of consciousness: sedated and patient remains intubated per anesthesia plan Pain management: pain level controlled Vital Signs Assessment: vitals unstable Respiratory status: patient remains intubated per anesthesia plan and patient on ventilator - see flowsheet for VS Cardiovascular status: unstable (paced, on inotropes, pressors, Impella assistance) Postop Assessment: no apparent nausea or vomiting Anesthetic complications: no Comments: Pt remains critically ill, ventilated, with hemodynamic support and ventricular assistance    No notable events documented.  Last Vitals:  Vitals:   2022/05/08 0020 2022/05/08 0032  BP:    Pulse:  80  Resp:  (!) 35  Temp:    SpO2: (!) 78% (!) 70%    Last Pain:  Vitals:   04/18/2022 1200  TempSrc: Bladder  PainSc:                  Denisha Hoel,E. Crewe Heathman

## 2022-05-09 NOTE — Op Note (Unsigned)
NAME: James White, PHENIX MEDICAL RECORD NO: [id number] ACCOUNT NO: [id number] DATE OF BIRTH: 1961/04/28 FACILITY: [facility] LOCATION: [facility] PHYSICIAN: Kerin Perna III, MD  Operative Report   DATE OF PROCEDURE: Apr 25, 2022  OPERATION:   1.  Placement of Impella 5.5 left ventricular assist device via right axillary artery through 10 mm graft. 2.  Placement of bilateral chest tubes following bilateral groin vascular procedures as dictated by Dr. Myra Gianotti.  PREOPERATIVE DIAGNOSES: 1.  Status post MI with cardiogenic shock on support with Impella CP and VA ECMO with groin cannulation. 2.  Acute hypoxic respiratory failure after bilateral groin exploration and vascular repair during ECMO decannulation.  SURGEON:  James Bussing, MD  ASSISTANT:  Gershon Crane, PA-C.  ANESTHESIA:  General.  CLINICAL NOTE:  The patient is a 61 year old gentleman who presented with an acute MI and in the process of being treated with PCI he had cardiogenic shock, cardiac arrest, and had an Impella CP placed in the right femoral artery and subsequently VA ECMO  placed with arterial cannulation in the left femoral artery and venous cannulation in the right femoral vein.  After 4 days of support his hemodynamic and pulmonary status improved and he was recommended for ECMO decannulation with transition to a  larger 5.5 Impella LVAD.  In order to provide optimal hemodynamic support after withdrawal of ECMO.  I discussed the procedure with the patient's family in the ICU prior to the operation, included in the discussion was the expected benefits of the  procedure as well as the risks of bleeding, organ failure, and death.  Informed consent was obtained.  DESCRIPTION OF PROCEDURE:  The patient was brought carefully from the ICU to the OR by the ECMO team, respiratory therapy and critical care nursing and anesthesia.  The patient was placed supine on the operating table and positioned.  The patient  remained stable.  The  chest, abdomen and legs were all prepped as a sterile field.  A proper timeout was performed.  A transesophageal echo probe was placed by the anesthesia team.  An incision was made beneath the right clavicle and dissection carried  down to the deep layer of fat to the pectoralis major and dividing the pectoralis minor.  Due to the patient's short, obese body habitus, exposure was extremely difficult and the deep bladed field retractor was used with barely enough depth to expose the  fat pad containing the axillary vessels and the brachial plexus.  The right axillary artery was dissected carefully from the surrounding cords of the brachial plexus and the large and distended surrounding veins of the axillary and subclavian venous system.  A vessel loop was placed around the axillary artery and 4000  units of heparin was administered.  The vessel was clamped and an arteriotomy was made.  A Gelweave 10 mm graft was then sewn end-to-side with running 5-0 Prolene as a conduit to allow passage of the Impella 5.5 catheter.  The anastomosis was completed  and covered with a light layer of CoSeal for hemostasis.  The clamps were removed and there was good hemostasis.  Next, under C-arm fluoroscopy and with the direct assistance of Dr. Sherlie Ban guidewires, catheters and then the Impella guidewire was passed retrograde down to the innominate artery into the aortic root and through the aortic valve.  In order to get the  Impella 5.5 catheter placed the previously placed Impella CP was withdrawn so that it was in the descending thoracic aorta out of the way for  friction where the incoming new Impella 5.5.  The catheter was placed with satisfactory position and good flow  and the Impella CP was turned off and the Impella CP provided a good hemodynamic support at 4 liters per minute.  After several minutes of following the performance of the Impella 5.5 it was apparent that although it was generating flow that the  electronic blood pressure transducer was not functional.  The rep was present and did a troubleshooting of the system and  recommended that a new catheter be placed.  For that reason, the Impella catheter was then withdrawn without difficulty under and over the same guidewires and similar technique a new Impella was placed through the 10 mm Gelweave graft into very good  position in the LV with good performance and normal blood pressure readings and flow monitoring.  The graft was then shortened and introducer was placed into the graft and secured with several heavy silk sutures to prevent any backbleeding.  The wound was irrigated.  The wound was then closed in layers using interrupted #1 Vicryl for the pectoralis  muscle layer and a running 2-0 Vicryl for the subcutaneous skin.  Skin staples were used to close the skin and the introducer was secured to the right chest with several interrupted heavy silk sutures.  The next part of the procedure was ECMO decannulation and vascular repair of bilateral groin incisions in the femoral vessels in each groin as described by Dr. Myra Gianotti.  During the latter part of this procedure, the patient's oxygen saturation dropped  precipitously.  I placed an emergency bilateral, 28-French chest tubes in each pleural space to make sure that there was no pneumothorax or buildup of fluid.  A moderate amount of serosanguineous fluid drained out of the right pleural space and minimal  drainage of the fluid from the left pleural space.  The chest tubes were placed to Pleur-Evac suction for postoperative support.  The patient was transferred back to the ICU in critical condition after the leg incisions had been both closed.   PUS D: 04/24/2022 9:40:03 am T: 04/24/2022 7:24:00 pm  JOB: [id number]/ [id number]

## 2022-05-09 NOTE — Progress Notes (Signed)
Left radial arterial line attempted X2 unsuccessful. Patient is on multiple vasoactive agents and systolic pressures are in the 16X. Patient is profoundly hypotensive  Able to puncture the arterial lumen and get blood return but unable to thread the catheter via vascular resistance. RN made aware.   Danyale Ridinger L. Katrinka Blazing, BS, RRT-ACCS, RCP

## 2022-05-09 NOTE — Discharge Summary (Signed)
DEATH SUMMARY   Patient Details  Name: James White MRN: [id number] DOB: 09-Oct-1961  Admission/Discharge Information   Admit Date:  04-24-22  Date of Death: Date of Death: (P) 04/29/22  Time of Death: Time of Death: (P) 0635  Length of Stay: 5  Referring Physician: Donnamae Jude, PA-C (Inactive)   Reason(s) for Hospitalization  ST elevation MI  Diagnoses  Preliminary cause of death: hemorrhagic shock.  Secondary Diagnoses (including complications and co-morbidities):  Principal Problem:   Complete heart block (HCC) Active Problems:   Cardiogenic shock (HCC)   Acute on chronic respiratory failure with hypoxia and hypercapnia (HCC)   Acute kidney injury (AKI) with acute tubular necrosis (ATN) (HCC)   Cardiac arrest (HCC)   Hemorrhage due to vascular catheter (HCC)   STEMI (ST elevation myocardial infarction) (HCC)   CAD (coronary artery disease)   Brief Hospital Course (including significant findings, care, treatment, and services provided and events leading to death)  James White is a 61 y.o. year old male who suffered a syncopal episode at work. Hypotensive and bradycardic on arrival he was intubated and brought to cath lab for anterior STEMI.   He developed cardiogenic shock. The patient was externally paced and taken emergently to the cath lab with plans for temporary pacing and L/RHC. He was found to have 80% pRCA, 100% ostial Lcx, 80% mLM to p LAD, 95% proximal LAD. Culprit thought to be occluded Lcx, unable to locate origin. D/t persistent shock Impella CP was placed for mechanical support and he underwent 2 overlapping DES m LM into p LAD. He suffered PEA arrest post intervention. D/t profound shock with acidemia and inability to ventilate he was cannulated for VA ECMO.   He weaned off vasopressor support and tolerated CRRT fluid removal with improvement in lung function. He tolerated dropping ECMO flow and he was scheduled for ECMO decannulation with backup Impella  5.5 support.   The patient development hemorrhagic shock following removal of the right femoral drainage cannula. The patient did require significant blood transfusion and was coagulopathic.  There was oozing from all the raw surface areas as well as several vein branches in both groins which were able to be controlled with suture ligation.  The patient did require chest compressions for asystole.  He was able to regain a pulse and get volume resuscitated.  He continue to decline and the family decided to allow a natural death.    Pertinent Labs and Studies  Significant Diagnostic Studies DG Chest Portable 1 View  Result Date: 04/29/2022 CLINICAL DATA:  Respiratory failure EXAM: PORTABLE CHEST 1 VIEW COMPARISON:  5:04 a.m. FINDINGS: Transesophageal echocardiography probe in expected position overlying the cardiac silhouette. Endotracheal tube has been removed. Nasoenteric feeding tube extends into the upper abdomen beyond the margin examination but appears folded upon itself within the proximal esophagus. Left internal jugular hemodialysis catheter is again identified with its tip abutting and mildly deforming the right lateral wall of the superior vena cava. Withdrawal of the catheter by approximately 3.5 cm would better position the catheter tip at the confluence of the superior vena cava. Left ventricular assist device has been repositioned and is now seen approaching via the right subclavian artery. The terminal pigtail component of the assist device is not visualized on this examination. Left internal jugular central venous catheter with its tip within the brachiocephalic vein and right internal jugular transvenous pacemaker lead with its tip overlying the expected right ventricle are unchanged. Lung volumes are small, however,  pulmonary insufflation appears stable since prior examination. Bilateral chest tubes have been placed. Small right pneumothorax is present with medial and basilar components  noted. No radiographic evidence of tension physiology. Right basilar atelectasis noted. No pleural effusion. Cardiac size is mildly enlarged. Stable widening of the superior mediastinum, possibly accentuated by poor pulmonary insufflation and supine positioning. IMPRESSION: 1. Support lines and tubes as described above. Nasoenteric feeding tube appears folded upon itself within the proximal esophagus. Left subclavian hemodialysis catheter abuts and deforms the right lateral wall the superior vena cava and withdrawal by approximately 3.5 cm would better position the catheter tip at the confluence of the superior vena cava. Alternatively, the catheter could be advanced into the superior cavoatrial junction under fluoroscopic guidance. Right subclavian left ventricular assist device in expected position, however, terminal pigtail component is not visualized on this exam. 2. Interval placement of bilateral chest tubes. Small right pneumothorax. No radiographic evidence of tension physiology. 3. Pulmonary hypoinflation. 4. Stable widening of the superior mediastinum, possibly accentuated by supine positioning and poor pulmonary insufflation. 5. These results will be called to the ordering clinician or representative by the Radiologist Assistant, and communication documented in the PACS or [employer]. Electronically Signed   By: Fidela Salisbury M.D.   On: 05/04/22 00:31   DG C-Arm 1-60 Min-No Report  Result Date: 05-04-22 Fluoroscopy was utilized by the requesting physician.  No radiographic interpretation.   ECHOCARDIOGRAM LIMITED  Result Date: 05/05/2022    ECHOCARDIOGRAM LIMITED REPORT   Patient Name:   James White Walthour Date of Exam: 05/02/2022 Medical Rec #:  [id number]     Height:       68.0 in Accession #:    [id number]    Weight:       275.1 lb Date of Birth:  [birth year]     BSA:          2.340 m Patient Age:    61 years      BP:           84/71 mmHg Patient Gender: M             HR:           78 bpm.  Exam Location:  Inpatient Procedure: Limited Echo Indications:    CHF-Acute Systolic M33.61  History:        Patient has prior history of Echocardiogram examinations, most                 recent 04/20/2022. CAD; Risk Factors:Diabetes, Hypertension and                 Dyslipidemia. Impella Placement. ECMO. Patient reassessed this                 afternoon for ECMO weaning.  Sonographer:    Darlina Sicilian RDCS Referring Phys: [place]  1. LV underfilled. Impella noted. FINDINGS  Left Ventricle: LV underfilled. Impella noted. Candee Furbish MD Electronically signed by Candee Furbish MD Signature Date/Time: 04/10/2022/2:40:13 PM    Final    ECHOCARDIOGRAM LIMITED  Result Date: 04/18/2022    ECHOCARDIOGRAM LIMITED REPORT   Patient Name:   James White Date of Exam: 05/05/2022 Medical Rec #:  [id number]     Height:       68.0 in Accession #:    [id number]    Weight:       275.1 lb Date of Birth:  1961/01/21     BSA:  2.340 m Patient Age:    57 years      BP:           86/68 mmHg Patient Gender: M             HR:           80 bpm. Exam Location:  Inpatient Procedure: Limited Echo and Limited Color Doppler Indications:    Impella placement. ECMO.  History:        Patient has prior history of Echocardiogram examinations, most                 recent 04/20/2022. CAD; Risk Factors:Diabetes and Hypertension.  Sonographer:    Johny Chess RDCS Referring Phys: [phone] AMY D CLEGG IMPRESSIONS  1. Impella 3.9 cm measured from tip to AV annulus.     ECMO flow noted on Doppler signal . Left ventricular ejection fraction, by estimation, is <20%. The left ventricle has severely decreased function. The left ventricle demonstrates global hypokinesis.  2. Moderate to severe mitral valve regurgitation. FINDINGS  Left Ventricle: Impella 3.9 cm measured from tip to AV annulus. ECMO flow noted on Doppler signal. Left ventricular ejection fraction, by estimation, is <20%. The left ventricle has severely decreased  function. The left ventricle demonstrates global hypokinesis. Mitral Valve: Moderate to severe mitral valve regurgitation. Candee Furbish MD Electronically signed by Candee Furbish MD Signature Date/Time: 04/08/2022/10:51:30 AM    Final    DG CHEST PORT 1 VIEW  Result Date: 04/16/2022 CLINICAL DATA:  [id number]; ETT/on ECMO EXAM: PORTABLE CHEST 1 VIEW COMPARISON:  None Available. FINDINGS: Endotracheal tube, feeding tube, right transjugular catheter with its tip in the right heart, 2 left transjugular catheters in the upper SVC, and aortic Impella device are stable. Cardiomegaly and widening of the mediastinum, stable. Low lung volume. Lungs remain clear. IMPRESSION: 1.  Stable supporting apparatus. 2.  Cardiomegaly and widening of the mediastinum, stable. 3.  Low lung volumes.  Lungs remain clear. Electronically Signed   By: Frazier Richards M.D.   On: 05/04/2022 08:04   DG Abd 1 View  Result Date: 04/20/2022 CLINICAL DATA:  Abdominal distension. EXAM: ABDOMEN - 1 VIEW COMPARISON:  04/19/2012 FINDINGS: A feeding tube is again seen with the tip in the gastric antrum. The bowel gas pattern is normal. Bilateral femoral catheters are also noted. Probable also noted within the bladder. IMPRESSION: Feeding tube tip in the gastric antrum. Normal bowel gas pattern. Electronically Signed   By: Marlaine Hind M.D.   On: 04/20/2022 18:19   ECHOCARDIOGRAM LIMITED  Result Date: 04/20/2022    ECHOCARDIOGRAM LIMITED REPORT   Patient Name:   James White Date of Exam: 04/20/2022 Medical Rec #:  [id number]   Height:       68.0 in Accession #:    [id number]  Weight:       259.5 lb Date of Birth:  May 18, 1961   BSA:          2.283 m Patient Age:    47 years    BP:           77/51 mmHg Patient Gender: M           HR:           82 bpm. 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 Risk Factors:Hypertension.  Sonographer:    Bernadene Person RDCS Referring Phys: [id number] [place]  1. Impella in stable position in LV. Left ventricular ejection fraction, by estimation, is 40 to 45%. The left ventricle has mildly decreased function. The left ventricle demonstrates global hypokinesis. There is moderate concentric left ventricular hypertrophy.  2. Right ventricular systolic function is moderately reduced. The right ventricular size is normal. Tricuspid regurgitation signal is inadequate for assessing PA pressure.  3. The mitral valve is abnormal. Mild to moderate mitral valve regurgitation. No evidence of mitral stenosis.  4. The inferior vena cava is dilated in size with <50% respiratory variability, suggesting right atrial pressure of 15 mmHg.  5. Limited echo FINDINGS  Left Ventricle: Impella in stable position in LV. Left ventricular ejection fraction, by estimation, is 40 to 45%. The left ventricle has mildly decreased function. The left ventricle demonstrates global hypokinesis. The left ventricular internal cavity  size was normal in size. There is moderate concentric left ventricular hypertrophy. Right Ventricle: The right ventricular size is normal. Right ventricular systolic function is moderately reduced. Tricuspid regurgitation signal is inadequate for assessing PA pressure. Pericardium: Trivial pericardial effusion is present. Mitral Valve: The mitral valve is abnormal. Mild to moderate mitral valve regurgitation. No evidence of mitral valve stenosis. Pulmonic Valve: The pulmonic valve was normal in structure. Pulmonic valve regurgitation is trivial. Venous: The inferior vena cava is dilated in size with less than 50% respiratory variability, suggesting right atrial pressure of 15 mmHg. [employer] Electronically signed by Franki Monte Signature Date/Time: 04/20/2022/3:53:28 PM    Final    DG CHEST PORT 1 VIEW  Result Date:  04/20/2022 CLINICAL DATA:  Chest tube placement.  ECMO EXAM: PORTABLE CHEST 1 VIEW COMPARISON:  04/19/2022 FINDINGS: Endotracheal tube unchanged. Transjugular catheter in the RIGHT heart. Two LEFT central venous lines in the SVC. Aortic Impella device noted. Widened mediastinum similar prior. Lungs are clear. Low lung volumes. IMPRESSION: 1. Stable support apparatus. 2. No pulmonary edema or pneumothorax. 3. Prominent mediastinal width unchanged. Electronically Signed   By: Suzy Bouchard M.D.   On: 04/20/2022 08:23   DG Abd Portable 1V  Result Date: 04/19/2022 CLINICAL DATA:  Feeding tube placement. EXAM: PORTABLE ABDOMEN - 1 VIEW COMPARISON:  None Available. FINDINGS: The feeding tube tip is in the antropyloric region of the stomach. IMPRESSION: Feeding tube tip is in the antropyloric region of the stomach. Electronically Signed   By: Marijo Sanes M.D.   On: 04/19/2022 11:23   DG CHEST PORT 1 VIEW  Result Date: 04/19/2022 CLINICAL DATA:  [id number] congestive heart failure, on ECMO, follow-up study EXAM: PORTABLE CHEST 1 VIEW COMPARISON:  April 18, 2022 FINDINGS: Again seen are the endotracheal tube, NG tube, left transjugular central venous catheter with its tip at the junction of the left brachiocephalic vein and SVC, left subclavian double-lumen central venous catheter with its tip at the upper SVC. Right transjugular catheter with its tip appears to be in the right ventricle. There is an electrical pad overlying the left lower thorax, obscuring the left lung base stable. Cardiomediastinal silhouette is prominent. Low lung volumes. Moderate pulmonary vascular congestion and has mildly improved in the interim. Likely mild bilateral pleural effusion. The visualized skeletal structures show moderate thoracic spondylosis. IMPRESSION: 1. There has been no significant interval change in the supporting tubes. 2.  Cardiomediastinal silhouette is prominent. 3. Moderate pulmonary vascular congestion and has  improved in the interim. Low lung volumes. Electronically Signed  By: Frazier Richards M.D.   On: 04/19/2022 08:35   ECHOCARDIOGRAM LIMITED  Result Date: 04/18/2022    ECHOCARDIOGRAM LIMITED REPORT   Patient Name:   James White Date of Exam: 04/18/2022 Medical Rec #:  [id number]     Height:       68.0 in Accession #:    [id number]    Weight:       282.2 lb Date of Birth:  12/11/1960     BSA:          2.366 m Patient Age:    35 years      BP:           67/61 mmHg Patient Gender: M             HR:           80 bpm. Exam Location:  Inpatient Procedure: Limited Echo and Intracardiac Opacification Agent Indications:    CHF  History:        Patient has prior history of Echocardiogram examinations, most                 recent 05/04/2022. CAD, Cardiac cath 04/17/21; Risk                 Factors:Hypertension, Diabetes and Dyslipidemia. Cardiac arrest.                 Renal failure on bedside dialysis. Respiratory failure on ECMO.  Sonographer:    Merrie Roof RDCS Referring Phys: [place]  1. Left ventricular ejection fraction, by estimation, is 40 to 45%. The left ventricle has mildly decreased function. Comparison(s): No significant change from prior study. Prior images reviewed side by side. Definity contrast was used. No Doppler images were performed. FINDINGS  Left Ventricle: No left ventricular thrombus is seen. Impella well poitioned across the aortic valve. Left ventricular ejection fraction, by estimation, is 40 to 45%. The left ventricle has mildly decreased function. Definity contrast agent was given IV  to delineate the left ventricular endocardial borders.  LV Wall Scoring: The antero-lateral wall and posterior wall are akinetic. The entire anterior wall, entire septum, entire apex, and entire inferior wall are normal. Pericardium: There is no evidence of pericardial effusion. Sanda Klein MD Electronically signed by Sanda Klein MD Signature Date/Time: 04/18/2022/2:11:38 PM    Final     DG CHEST PORT 1 VIEW  Result Date: 04/18/2022 CLINICAL DATA:  Congestive heart failure EXAM: PORTABLE CHEST 1 VIEW COMPARISON:  Chest x-ray dated April 17, 2022 FINDINGS: Stable position of ETT, enteric tube, left IJ approach catheter, left subclavian approach catheter, and Impella device. Stable enlarged cardiac and mediastinal contours. Right-greater-than-left airspace opacities. Small bilateral pleural effusions. No evidence of pneumothorax. IMPRESSION: 1. Stable support devices. 2. Right-greater-than-left airspace opacities, unchanged when compared to prior and likely due to pulmonary edema. Electronically Signed   By: Yetta Glassman M.D.   On: 04/18/2022 08:13   DG Chest Port 1 View  Result Date: 04/26/2022 CLINICAL DATA:  Respiratory failure with intubation, lines placed, ECMO. EXAM: PORTABLE CHEST 1 VIEW COMPARISON:  Portable chest earlier today at 7:50 a.m. FINDINGS: 7:59 p.m. ETT has been inserted and the tip is 4.9 cm from the carina. NGT enters the stomach but the intragastric course is not filmed. Small caliber catheter through a right IJ introducer sheath is difficult to follow distally but I believe it terminates in the right ventricle. There is a left IJ approach catheter terminating at  the brachiocephalic/SVC junction, left subclavian approach double-lumen catheter terminating in the upper SVC. On the left there is overlying wiring attached to a partially translucent electrical pad superimposing over the base. Small cylindrical electronic device superimposing at the level of the carina and proximal ascending aorta is attached to 2 thin parallel wires roughly paralleling the course of the thoracic aorta and extending off of the film in the upper abdomen. The heart is enlarged. There is increased moderate vascular engorgement, perihilar alveolar opacities radiating outward on the right-greater-than-left, and increased moderate right and small left pleural effusions. No pneumothorax is seen.  IMPRESSION: 1. Moderate vascular engorgement and perihilar edema on the right-greater-than-left have developed since the earlier film from today. Progress chest films recommended depending on clinical response. 2. Right-greater-than-left pleural effusions. 3. Support apparatus as detailed above. Electronically Signed   By: Telford Nab M.D.   On: 05/05/2022 20:20   CARDIAC CATHETERIZATION  Result Date: 04/23/2022 Images from the original result were not included.   Prox RCA lesion is 80% stenosed.   Ost Cx to Prox Cx lesion is 100% stenosed.   Mid LM to Prox LAD lesion is 80% stenosed.   Prox LAD lesion is 95% stenosed.   A stent was successfully placed using a SYNERGY XD [id number].   Post intervention, there is a 0% residual stenosis.   Post intervention, there is a 0% residual stenosis.   There is moderate to severe left ventricular systolic dysfunction.   LV end diastolic pressure is severely elevated.   The left ventricular ejection fraction is 35-45% by visual estimate. James White is a 61 y.o. male  [id number] LOCATION:  FACILITY: [facility] PHYSICIAN: Quay Burow, M.D. [birth year] DATE OF PROCEDURE:  04/13/2022 DATE OF DISCHARGE: CARDIAC CATHETERIZATION Marjorie Smolder drug-eluting stent LAD History obtained from chart review.  61 year old African-American male with history of diabetes and hypertension who collapsed at work.  He is proxy [facility] nursing room where he was found to be in complete heart block, and cardiogenic shock.  He is brought to the Cath Lab urgently for right left heart cath. PROCEDURE DESCRIPTION: The patient was brought to the second floor [facility] cath lab in the postabsorptive state. He was premedicated with IV Versed and fentanyl. His right groin was prepped and shaved in usual sterile fashion. Xylocaine 1% was used for local anesthesia. A 5 French sheath was inserted into the right common femoral artery using standard Seldinger technique.  Ultrasound was used to identify  the right common femoral artery and guide access.  A digital image was captured and placed the patient's chart.  A 7 French sheath was inserted into the right common femoral vein.  A 7 [place] balloontipped stimulation Swan-Ganz catheter was then advanced to the right heart chambers obtaining sequential pressures and pulmonary blood samples for the determination of cardiac output.  5 French right left second sinus and catheters were used for selective coronary angiography and left ventriculography speculate.  Isovue dye is used for the entirety of the case.  Retrograde aortic, ventricular and pullback pressures were recorded.  The LVEDP was measured at 38. The patient was persistently hypotensive and hypoxic.  The decision was made to place a Impella CP prior to intervention.  This was performed with the assistance of Dr. Gerald Stabs End who also placed a temporary transvenous pacemaker in the right IJ. The patient was placed on cangrelor drip and received heparin bolus followed by infusion with a therapeutic ACT.  I then stuck the  Impella diaphragm with a micropuncture needle and placed a long 6 French sheath alongside the Impella.  I then used a 6 [place] XB LAD 3.0 cm guide cath along the 0.14 [place] guidewire a 2.5 mm x 5012 mm long balloon to predilate the entire left main and proximal LAD.  I then placed a 3.5 x 38 mm Synergy drug-eluting stent across the distal left main into the proximal LAD and deployed at 14 atm Berry overlapped a 3.5 x 16 mm Synergy drug-eluting stent from the ostium of the left left main into the proximal mLAD and postdilated the entire stented segment with a 4 mm x 15 mm long noncompliant balloon resulting in reduction of a long 80 and focal 95% hypodense lesion to 0% residual TIMI-3 flow. HEMODYNAMICS:  1: LVEDP-38 2: Right atrial pressure-35/33 3: RV pressure-70/17 4: Pulmonary pressure-70/20 5: Pulmonary wedge pressure-A-wave 56, V wave 54, mean 48 6: Cardiac output-3.2 L/min with an index  of 1.45 L/min per metered squared   Mr. Chiang had cardiogenic shock probably related to an occluded circumflex.  We could never find the origin.  He had a small RCA with 80% segmental proximal stenosis.  He had successful stenting of his left main and distal LAD.  Because of inability to oxygenate despite Impella he was placed on VA ECMO.  Dr. Aundra Dubin will dictate the ECMO note.  He was placed on cangrelor, IV heparin as well and will be converted to p.o. Brilinta once an OG tube was placed and position was verified. Quay Burow. MD, [facility] 04/15/2022 2:52 PM    ECHOCARDIOGRAM LIMITED  Result Date: 05/05/2022    ECHOCARDIOGRAM LIMITED REPORT   Patient Name:   WHITAKER HOLDERMAN Marquina Date of Exam: 05/08/2022 Medical Rec #:  [id number]     Height:       68.0 in Accession #:    [id number]    Weight:       241.0 lb Date of Birth:  11/18/1960     BSA:          2.212 m Patient Age:    1 years      BP:           68/52 mmHg Patient Gender: M             HR:           70 bpm. Exam Location:  Inpatient Procedure: Limited Echo and Intracardiac Opacification Agent STAT ECHO Indications:    Cardiac Arrest i46.9  History:        Patient has no prior history of Echocardiogram examinations.                 Risk Factors:Hypertension, Diabetes and Dyslipidemia.  Sonographer:    Maudry Mayhew RDMS, RVT, RDCS Referring Phys: Ina Homes MD  Sonographer Comments: Stat echo performed in cath lab during impella and ecmo cannulation IMPRESSIONS  1. Left ventricular ejection fraction, by estimation, is 40 to 45%. The left ventricle has mildly decreased function. Normal apical function, hypokinesis in basal segments  2. Right ventricular systolic function is mildly reduced. The right ventricular size is normal.  3. Impella measures 4.4cm into LV from aortic valve FINDINGS  Left Ventricle: Left ventricular ejection fraction, by estimation, is 40 to 45%. The left ventricle has mildly decreased function. The left ventricle demonstrates regional  wall motion abnormalities. Definity contrast agent was given IV to delineate the left ventricular endocardial borders. Right Ventricle: The right ventricular size is normal. Right ventricular systolic  function is mildly reduced. Oswaldo Milian MD Electronically signed by Oswaldo Milian MD Signature Date/Time: 04/29/2022/4:44:35 PM    Final    DG Chest Port 1 View  Result Date: 05/07/2022 CLINICAL DATA:  Pt to ED from work c/o witnessed syncopal episode. Diaphoretic on arrival, cbg 286. Hypotensive at 76/57. Bradycardia. EXAM: PORTABLE CHEST 1 VIEW COMPARISON:  None Available. FINDINGS: Cardiac silhouette is normal in size. No mediastinal or hilar masses. Clear lungs. No pleural effusion or gross pneumothorax on this supine exam. Skeletal structures are grossly intact IMPRESSION: No active disease. Electronically Signed   By: Lajean Manes M.D.   On: 05/02/2022 07:58    Microbiology No results found for this or any previous visit (from the past 240 hour(s)).  Lab Basic Metabolic Panel: No results for input(s): "NA", "K", "CL", "CO2", "GLUCOSE", "BUN", "CREATININE", "CALCIUM", "MG", "PHOS" in the last 168 hours. Liver Function Tests: No results for input(s): "AST", "ALT", "ALKPHOS", "BILITOT", "PROT", "ALBUMIN" in the last 168 hours. No results for input(s): "LIPASE", "AMYLASE" in the last 168 hours. No results for input(s): "AMMONIA" in the last 168 hours. CBC: No results for input(s): "WBC", "NEUTROABS", "HGB", "HCT", "MCV", "PLT" in the last 168 hours. Cardiac Enzymes: No results for input(s): "CKTOTAL", "CKMB", "CKMBINDEX", "TROPONINI" in the last 168 hours. Sepsis Labs: No results for input(s): "PROCALCITON", "WBC", "LATICACIDVEN" in the last 168 hours.  Procedures/Operations  #1: Open exposure of bilateral common femoral arteries                       #2: Primary repair of left common femoral artery (removal of arterial ECMO cannula) #3: Primary repair of left superficial  femoral artery (removal of perfusion catheter) #4: Primary repair of right common femoral vein (removal of venous ECMO cannula) #5.  Primary repair of right common femoral artery (removal of Impella device) #6: Primary repair of right superficial femoral artery (removal of perfusion catheter) #7:  Bilateral Prevena wound VAC VA ECMO, Impella placement   Rmoni Keplinger 05/04/2022, 9:53 PM

## 2022-05-09 NOTE — Progress Notes (Signed)
Called emergently to the OR to assist with ventilator management ABG 7.1 7/91/31 PaO2  Sequence of events as I understand it : VA ECMO decannulated, developed profuse bleeding, PEA arrest, Impella readjusted by cardiology Nitric oxide started  On my arrival, vascular trying to control bleeding and closing femoral site.  Abdomen appears distended.  Bilateral chest tubes present. Ventilator waveforms reviewed, tidal volumes only 200 range, peak pressure 57  I increased PEEP gradually to 20, increased respiratory rate to 35, dropped tidal volume to 300 since he was only getting 200 anyways to decrease flows.  By this maneuvers I was able to keep auto PEEP low.  However repeat ABG showed worsening respiratory acidosis 6.9/110, PO2 remains low. Unfortunately, we are unable to oxygenate or ventilate at this time. Blood pressure improved slightly with Impella adjustment.  I agree that our only viable option is to try to remove fluid by CVVH and see if we can ventilate him better since VV ECMO is not an option.  Meantime we can use bicarb, 2 A given, to maintain pH 7.1 range.Prognosis appears grave  My independent critical care time was 33 minutes  Renella Steig V. Vassie Loll MD

## 2022-05-09 NOTE — Procedures (Signed)
Extubation Procedure Note  Patient Details:   Name: TANIA PERROTT DOB: [birth year] MRN: [id number]   Airway Documentation:    Vent end date: 04/16/2022 Vent end time: 0629   Evaluation  O2 sats: currently acceptable Complications: No apparent complications Patient did tolerate procedure well. Bilateral Breath Sounds: Diminished   No  Patient extubated to comfort per extubation order.  Romona Curls Brunella Wileman 04/18/2022, 6:30 AM

## 2022-05-09 NOTE — Progress Notes (Signed)
Perfusion arrived to 2H03 23-Apr-2022 @ 1400 for VA ECMO transport to OR for ECMO decannulation and insertion of Impella 5.5. ECMO OFF @ 2127. Transported back to [facility] 05/06/2022 @ 0015.

## 2022-05-09 NOTE — Progress Notes (Signed)
Patient transported from OR Room 17 to 2H03 on ventilator. No transport complications noted.

## 2022-05-09 NOTE — Transfer of Care (Signed)
Immediate Anesthesia Transfer of Care Note  Patient: James White  Procedure(s) Performed: DECANNULATION FOR  ECMO LEFT COMMON FEMORAL ARTERY REPAIR (Left: Groin) REMOVAL OF IMPELLA CP LEFT VENTRICULAR ASSIST DEVICE PLACEMENT OF IMPELLA  5.5 LEFT VENTRICULAR ASSIST DEVICE (Right: Chest) TRANSESOPHAGEAL ECHOCARDIOGRAM (TEE) (Esophagus)  Patient Location: ICU  Anesthesia Type:General  Level of Consciousness: unresponsive, comatose and Patient remains intubated per anesthesia plan  Airway & Oxygen Therapy: Patient remains intubated per anesthesia plan and Patient placed on Ventilator (see vital sign flow sheet for setting)  Post-op Assessment: Post -op Vital signs reviewed and unstable, Anesthesiologist notified  Post vital signs: Reviewed and unstable  Last Vitals:  Vitals Value Taken Time  BP    Temp    Pulse 159 May 19, 2022 0032  Resp 0 19-May-2022 0032  SpO2 70 % May 19, 2022 0032  Vitals shown include unvalidated device data.  Last Pain:  Vitals:   04/12/2022 1200  TempSrc: Bladder  PainSc:          Complications: No notable events documented.

## 2022-05-09 NOTE — Op Note (Signed)
Patient name: James White MRN: [id number] DOB: [birth year] Sex: male  05/09/2022 Pre-operative Diagnosis: ECMO decannulation Post-operative diagnosis:  Same Surgeon:  Durene Cal Assistants:  Doreatha Massed, PA Procedure:   #1: Open exposure of bilateral common femoral arteries   #2: Primary repair of left common femoral artery (removal of arterial ECMO cannula) #3: Primary repair of left superficial femoral artery (removal of perfusion catheter) #4: Primary repair of right common femoral vein (removal of venous ECMO cannula) #5.  Primary repair of right common femoral artery (removal of Impella device) #6: Primary repair of right superficial femoral artery (removal of perfusion catheter) #7:  Bilateral Prevena wound VAC   Anesthesia:  General Blood Loss:  See anesthesia record Specimens:  none   Indications: The patient comes in today for ECMO decannulation.  Prior to my portion of the procedure, Dr. Maren Beach placed a new Impella device via the right axillary approach  Procedure:  The patient was identified in the holding area and taken to [facility] OR ROOM 17  The patient was then placed supine on the table. general anesthesia was administered.  The patient was prepped and draped in the usual sterile fashion.  A time out was called and antibiotics were administered.  A PA was necessary to expedite the procedure and assist with technical details.  I first began in the left groin.  A longitudinal incision was made incorporating the skin access site of the arterial cannula.  Cautery and sharp dissection were used to expose the arteriotomy.  This was up under the inguinal ligament.  I got proximal control with a vessel loop.  I then got distal control of the common femoral artery with a vessel loop.  I then proceeded distally down onto the superficial femoral artery where the antegrade perfusion catheter entered the superficial femoral artery.  Proximal and distal control were obtained with  Vesseloops.  Next attention was turned towards the right groin.  I made a longitudinal incision incorporating the skin entry site of the Impella device and the venous cannula.  Cautery and sharp dissection were used to expose the common femoral artery proximal and distal to the cannulation site.  Vesseloops were placed.  I then exposed the common femoral vein.  The cannulation site was on the lateral aspect of the vein at the level of the saphenofemoral junction.  There were multiple posterior branches.  So I exposed the vein up to a point where I could get a Satinsky clamp around the vein.  I then further dissected out the superficial femoral artery where the antegrade perfusion catheter entered and got proximal and distal control here with Vesseloops.  The PA was instrumental in providing exposure as well as suctioning.  At this point I elected to remove the Impella catheter.  This was removed and vascular clamps were used for control once the cannula was removed.  There was thickened intima I was able to close the arteriotomy transversely with two 5-0 Prolene's.  Next, I remove the antegrade perfusion catheter from the superficial femoral artery closing the arteriotomy site with a U stitch 5-0 Prolene.  Attention was then turned towards the femoral vein.  I clamped the venous cannula and then remove this and placed a Satinsky clamp.  The venotomy was then closed with a running 5-0 Prolene.  I then removed the clamp and the suture had torn through the vein wall.  This was a significant tear to the vein and we lost a fair amount of blood.  In order to get better exposure, I had to ligate the saphenofemoral junction so that I could get circumferential proximal and distal control.  Once this was performed, I was able to to place additional sutures to close the vein.  The groin was then packed with a lap pad and attention was turned towards the left side.  I removed the arterial cannula and occluded the artery  with vascular clamps.  I inspected the anterior wall.  It was not very healthy but the intima appeared to be intact.  I felt primary closure was possible and so this was done with a running 5-0 Prolene in transverse fashion.  The clamps were then removed and this was hemostatic.  Attention was then turned towards the antegrade perfusion catheter.  The superficial femoral artery was then occluded proximal and distal to the arteriotomy site.  A 5-0 Prolene U stitch was used to close the arteriotomy site once the cannula was removed.  The patient did require significant blood transfusion and was coagulopathic.  There was oozing from all the raw surface areas as well as several vein branches in both groins which were able to be controlled with suture ligation.  The patient did require chest compressions for asystole.  He was able to regain a pulse and get volume resuscitated.  He was critically ill at this time.  We felt the best thing to do was to get him to the ICU for further stabilization.  I then closed both groins over a 15 Blake drain with several layers of 2-0 Vicryl suture.  The skin was stapled closed and a Prevena wound VAC was placed in each groin   Disposition: Critically ill, intubated to the ICU   V. Durene Cal, M.D., [facility] [place] Office: [phone] Pager:  [phone]

## 2022-05-09 NOTE — Significant Event (Signed)
I was called to the patient's bedside by his lovely family. They would like to pursue comfort care measures at this time. Will start comfort measures.

## 2022-05-09 DEATH — deceased
